# Patient Record
Sex: Female | Born: 1975 | Race: White | Hispanic: Yes | Marital: Single | Smoking: Never smoker
Health system: Southern US, Community
[De-identification: ages and names within clinical notes are randomized; demographics above are authoritative.]

## PROBLEM LIST (undated history)

## (undated) DIAGNOSIS — K8020 Calculus of gallbladder without cholecystitis without obstruction: Secondary | ICD-10-CM

## (undated) HISTORY — PX: CHOLECYSTECTOMY: SHX55

## (undated) HISTORY — DX: Calculus of gallbladder without cholecystitis without obstruction: K80.20

---

## 1998-07-20 ENCOUNTER — Inpatient Hospital Stay (HOSPITAL_COMMUNITY): Admission: AD | Admit: 1998-07-20 | Discharge: 1998-07-22 | Payer: Self-pay | Admitting: *Deleted

## 1999-08-04 ENCOUNTER — Ambulatory Visit (HOSPITAL_COMMUNITY): Admission: RE | Admit: 1999-08-04 | Discharge: 1999-08-04 | Payer: Self-pay | Admitting: Obstetrics

## 1999-08-11 ENCOUNTER — Inpatient Hospital Stay (HOSPITAL_COMMUNITY): Admission: AD | Admit: 1999-08-11 | Discharge: 1999-08-11 | Payer: Self-pay | Admitting: Obstetrics

## 1999-12-04 ENCOUNTER — Inpatient Hospital Stay (HOSPITAL_COMMUNITY): Admission: AD | Admit: 1999-12-04 | Discharge: 1999-12-05 | Payer: Self-pay | Admitting: Obstetrics & Gynecology

## 1999-12-04 ENCOUNTER — Encounter: Payer: Self-pay | Admitting: Obstetrics & Gynecology

## 2002-08-25 ENCOUNTER — Emergency Department (HOSPITAL_COMMUNITY): Admission: EM | Admit: 2002-08-25 | Discharge: 2002-08-25 | Payer: Self-pay | Admitting: Emergency Medicine

## 2002-08-25 ENCOUNTER — Encounter: Payer: Self-pay | Admitting: Emergency Medicine

## 2003-05-19 ENCOUNTER — Other Ambulatory Visit: Admission: RE | Admit: 2003-05-19 | Discharge: 2003-05-19 | Payer: Self-pay | Admitting: Gynecology

## 2003-11-09 ENCOUNTER — Inpatient Hospital Stay (HOSPITAL_COMMUNITY): Admission: AD | Admit: 2003-11-09 | Discharge: 2003-11-11 | Payer: Self-pay | Admitting: Gynecology

## 2003-11-10 ENCOUNTER — Encounter (INDEPENDENT_AMBULATORY_CARE_PROVIDER_SITE_OTHER): Payer: Self-pay | Admitting: *Deleted

## 2007-03-15 ENCOUNTER — Emergency Department (HOSPITAL_COMMUNITY): Admission: EM | Admit: 2007-03-15 | Discharge: 2007-03-15 | Payer: Self-pay | Admitting: Emergency Medicine

## 2007-06-12 ENCOUNTER — Emergency Department (HOSPITAL_COMMUNITY): Admission: EM | Admit: 2007-06-12 | Discharge: 2007-06-12 | Payer: Self-pay | Admitting: Emergency Medicine

## 2008-09-13 ENCOUNTER — Emergency Department (HOSPITAL_COMMUNITY): Admission: EM | Admit: 2008-09-13 | Discharge: 2008-09-13 | Payer: Self-pay | Admitting: Emergency Medicine

## 2008-10-13 ENCOUNTER — Encounter (INDEPENDENT_AMBULATORY_CARE_PROVIDER_SITE_OTHER): Payer: Self-pay | Admitting: General Surgery

## 2008-10-13 ENCOUNTER — Ambulatory Visit (HOSPITAL_COMMUNITY): Admission: RE | Admit: 2008-10-13 | Discharge: 2008-10-13 | Payer: Self-pay | Admitting: General Surgery

## 2011-05-08 NOTE — Op Note (Signed)
NAMEMarland Kitchen  SHERIN, MURDOCH NO.:  [id number]   MEDICAL RECORD NO.:  [id number]          PATIENT TYPE:  AMB   LOCATION:  DAY                          FACILITY:  [facility]   PHYSICIAN:  Juanetta Gosling, MDDATE OF BIRTH:  1976/03/06   DATE OF PROCEDURE:  10/13/2008  DATE OF DISCHARGE:                               OPERATIVE REPORT   PREOPERATIVE DIAGNOSES:  Chronic cholecystitis, status post an acute  cholecystitis episode about a month ago.   POSTOPERATIVE DIAGNOSES:  Chronic cholecystitis, status post an acute  cholecystitis episode about a month ago.   PROCEDURE:  Laparoscopic cholecystectomy.   SURGEON:  Juanetta Gosling, M.D.   ASSISTANT:  Alfonse Ras, M.D.   ANESTHESIA:  General.   SPECIMENS:  Gallbladder and contents.   DISPOSITION OF SPECIMEN:  To pathology.   ESTIMATED BLOOD LOSS:  Minimal.   COMPLICATIONS:  None.   DRAINS:  None.   DISPOSITION OF PATIENT:  To PACU in stable condition.   HISTORY:  This is a 35 year old female, who had an emergency room visit  on September 13, 2008, with right upper quadrant pain, where she  underwent an ultrasound.  She had a 1.5 cm gallstone at the neck with a  positive sonographic Murphy sign.  Her LFTs were normal.  Her common  duct was 3 mm.  She was then referred to the surgery practice for  consideration for laparoscopic cholecystectomy.  On my exam, she  appeared to have had acute cholecystitis, which was still causing her  some significant right upper quadrant pain, scheduled for laparoscopic  cholecystectomy.   DESCRIPTION OF PROCEDURE:  After informed consent was taken, the patient  was administered 1 gm Cefoxitin.  She was taken to the operating room.  Sequential compression devices were placed on her lower extremities.  She underwent general endotracheal anesthesia without complications.  Her abdomen was prepped and draped in standard sterile surgical fashion.  Surgical time-out was then  performed.  A 10 mm incision was used, going  through her prior bilateral tubal ligation scar.  There was a dense  amount of scarring in her umbilicus.  The fascia was identified and  incised.  Peritoneum was then entered bluntly.  A Vicryl purse-string  suture was placed around the fascia and a Hasson trocar was then  introduced.  The abdomen was then insufflated to 15 mmHg pressure.  She  tolerated this well.  She was then placed in a head-up position.  Three  further 5 mm trocars were placed in the epigastrium and two in the right  upper quadrant, under direct vision, after infiltration with local  anesthetic.   There were some adhesions to her liver from her omentum.  These were  taken down bluntly.  The gallbladder was then retracted cephalad and  lateral.  The triangle of Calot was then dissected.  The cystic duct and  cystic artery were clearly identified and entered the gallbladder and  the critical view of safety was obtained.  Following this, I clipped the  cystic duct three times, cut it and left two clips in place.  The same  was done to the cystic  artery.  Electrocautery was then used to remove  the gallbladder from the liver bed.  There was a small amount of liver  bleeding.  This was controlled with electrocautery on the liver bed.  A  5 mm camera was then placed in the epigastrium.  The EndoCatch was then  placed in the umbilicus.  The gallbladder was then placed in the  EndoCatch and brought out through the umbilical incision without  difficulty.  This suture was then tied down and there was no leak and no  evidence of a hernia following this.   Some irrigation was performed.  Hemostasis was observed.  The clips were  in good position.  All trocars were then removed.  The abdomen was  desufflated.  All incisions were then closed with 4-0 Monocryl in a  subcuticular fashion.  Dermabond was then placed over the wounds.  She  tolerated this well, was extubated in the  operating room and transferred  to the PACU in stable condition.      Juanetta Gosling, MD  Electronically Signed     MCW/MEDQ  D:  10/13/2008  T:  10/13/2008  Job:  [id number]

## 2011-05-11 NOTE — Discharge Summary (Signed)
Stacey Blackwell, Stacey Blackwell                         ACCOUNT NO.:  [id number]   MEDICAL RECORD NO.:  [id number]                   PATIENT TYPE:  INP   LOCATION:  9138                                 FACILITY:  [facility]   PHYSICIAN:  Juan H. Lily Peer, M.D.             DATE OF BIRTH:  1976-06-12   DATE OF ADMISSION:  11/09/2003  DATE OF DISCHARGE:  11/11/2003                                 DISCHARGE SUMMARY   DISCHARGE DIAGNOSES:  1. Intrauterine pregnancy at 40+ weeks, delivered.  2. Desires permanent sterilization.  3. Status post vacuum assisted vaginal delivery.  4. Status post bilateral tubal sterilization, Pomeroy technique, by Dr. Reynaldo Minium on  November 10, 2003.   HISTORY:  This is a 35 year old female, gravida 4, para 2, with an EDC of  November 06, 2003. Prenatal course was essentially uncomplicated. The  patient desired permanent sterilization.   HOSPITAL COURSE:  On November 09, 2003, the patient was admitted at 40+  weeks in labor. Subsequently, the patient underwent a vacuum-assisted  vaginal delivery on November 09, 2003, secondary to epidural affect of a  female, Apgars 8 and 9, weight 8 pounds 10 ounces. There was a second degree  vaginal/perineal tear. There were no complications. On November 10, 2003,  the patient underwent a bilateral tubal sterilization procedure via Pomeroy  technique by Dr. Reynaldo Minium without complications. The patient remained  afebrile, voiding, in stable condition, and she was discharged to home on  November 11, 2003, in satisfactory condition.  On subsequent workup on her  labs, the patient is AB positive, rubella immune. On November 10, 2003,  hemoglobin was 9.5.   DISPOSITION:  The patient is discharged to home, follow up in six weeks. If  any problems prior to that time, she should be seen in the office.     Susa Loffler, P.A.                    Juan H. Lily Peer, M.D.    TSG/MEDQ  D:  12/06/2003  T:  12/06/2003  Job:   [id number]

## 2011-05-11 NOTE — Op Note (Signed)
Stacey Blackwell, Stacey Blackwell                         ACCOUNT NO.:  [id number]   MEDICAL RECORD NO.:  [id number]                   PATIENT TYPE:  INP   LOCATION:  9138                                 FACILITY[facility]   PHYSICIAN:  Juan H. Lily Peer, M.D.             DATE OF BIRTH:  Sep 24, 1976   DATE OF PROCEDURE:  11/10/2003  DATE OF DISCHARGE:                                 OPERATIVE REPORT   PREOPERATIVE DIAGNOSES:  1. Status post normal spontaneous vaginal delivery November 09, 2003.  2. Patient requesting elective permanent sterilization.   POSTOPERATIVE DIAGNOSES:  1. Status post normal spontaneous vaginal delivery November 09, 2003.  2. Patient requesting elective permanent sterilization.   PROCEDURE PERFORMED:  Bilateral tubal sterilization procedure, Pomeroy  technique.   SURGEON:  Juan H. Lily Peer, M.D.   ANESTHESIA:  General endotracheal anesthesia.   INDICATION FOR OPERATION:  A 35 year old gravida 4, para 3, AB 1, status  post normal spontaneous vaginal delivery, requesting elective permanent  sterilization.  Risks, benefits, pros and cons of the operation were  discussed.  Patient fully aware that this is permanent sterilization and  will not be able to have any more children.  All of the counseling was done  in [place].  She and her husband were present.  All questions were answered.   DESCRIPTION OF OPERATION:  After the patient was adequately counseled she  was taken to the operating room, where she underwent a successful general  endotracheal anesthesia.  The bladder was evacuated of contents for  approximately 25 mL and the abdomen was prepped and draped in the usual  sterile fashion.  A semilunar incision was made underneath the umbilicus.  The incision was carried down to the layer of fascia.  The peritoneal cavity  was entered with exposure utilizing [employer] retractors.  The peritoneal  cavity was visualized and with the use of a vein retractor, the left  fallopian tube was brought into view and the Babcock clamp was utilized to  grasp the fallopian tube and was identified and the proximal 2 cm segment  was suture ligated with 3-0 Vicryl suture x2 and a 2 cm segment was excised  and passed off the operative field for histologic evaluation.  The remaining  stumps were Bovie cauterized.  A similar procedure was carried out on the  contralateral side.  Once this was completed, the fascia was closed with a  running stitch of 3-0 Vicryl suture.  The subcutaneous bleeders were Bovie  cauterized.  The skin was reapproximated with a subcuticular stitch of 4-0  plain catgut suture.  For postoperative analgesia, 0.25% Marcaine was  infiltrated into the incision site.  The patient was extubated, transferred  to the recovery room with stable vital signs.  Blood loss was minimal.  Fluid resuscitation consisted of 500 mL of lactated Ringer's.  She did  receive 1 g of Ancef prophylactically.  Juan H. Lily Peer, M.D.    JHF/MEDQ  D:  11/10/2003  T:  11/11/2003  Job:  [id number]

## 2011-09-24 LAB — URINALYSIS, ROUTINE W REFLEX MICROSCOPIC
Bilirubin Urine: NEGATIVE
Glucose, UA: NEGATIVE
Hgb urine dipstick: NEGATIVE
Ketones, ur: NEGATIVE
Nitrite: NEGATIVE
Protein, ur: NEGATIVE
Specific Gravity, Urine: 1.018
Urobilinogen, UA: 0.2
pH: 6.5

## 2011-09-24 LAB — CBC
HCT: 35.3 — ABNORMAL LOW
Hemoglobin: 11.6 — ABNORMAL LOW
MCHC: 33
MCV: 76.3 — ABNORMAL LOW
Platelets: 344
RBC: 4.62
RDW: 15.1
WBC: 7.9

## 2011-09-24 LAB — DIFFERENTIAL
Basophils Absolute: 0
Basophils Relative: 0
Eosinophils Absolute: 0.2
Eosinophils Relative: 2
Lymphocytes Relative: 35
Lymphs Abs: 2.7
Monocytes Absolute: 0.6
Monocytes Relative: 8
Neutro Abs: 4.3
Neutrophils Relative %: 55

## 2011-09-24 LAB — LIPASE, BLOOD: Lipase: 25

## 2011-09-24 LAB — COMPREHENSIVE METABOLIC PANEL
ALT: 21
AST: 24
Albumin: 3.4 — ABNORMAL LOW
Alkaline Phosphatase: 56
BUN: 10
CO2: 26
Calcium: 9
Chloride: 105
Creatinine, Ser: 0.71
GFR calc Af Amer: >60
GFR calc non Af Amer: >60
Glucose, Bld: 104 — ABNORMAL HIGH
Potassium: 4.1
Sodium: 137
Total Bilirubin: 0.8
Total Protein: 6.6

## 2011-09-24 LAB — POCT PREGNANCY, URINE: Preg Test, Ur: NEGATIVE

## 2011-09-25 LAB — HEMOGLOBIN AND HEMATOCRIT, BLOOD
HCT: 36
Hemoglobin: 11.9 — ABNORMAL LOW

## 2011-09-25 LAB — COMPREHENSIVE METABOLIC PANEL WITH GFR
ALT: 32
AST: 25
Albumin: 3.4 — ABNORMAL LOW
Alkaline Phosphatase: 56
BUN: 8
CO2: 25
Calcium: 8.7
Chloride: 108
Creatinine, Ser: 0.63
GFR calc non Af Amer: >60
Glucose, Bld: 110 — ABNORMAL HIGH
Potassium: 3.9
Sodium: 140
Total Bilirubin: 0.5
Total Protein: 6.7

## 2011-09-25 LAB — PREGNANCY, URINE: Preg Test, Ur: NEGATIVE

## 2012-06-09 DIAGNOSIS — R1011 Right upper quadrant pain: Secondary | ICD-10-CM | POA: Insufficient documentation

## 2012-06-09 DIAGNOSIS — R1013 Epigastric pain: Secondary | ICD-10-CM | POA: Insufficient documentation

## 2012-06-10 ENCOUNTER — Emergency Department (HOSPITAL_COMMUNITY): Payer: BC Managed Care – PPO

## 2012-06-10 ENCOUNTER — Emergency Department (HOSPITAL_COMMUNITY)
Admission: EM | Admit: 2012-06-10 | Discharge: 2012-06-10 | Disposition: A | Discharge status: Home / Self Care | Payer: BC Managed Care – PPO | Attending: Emergency Medicine | Admitting: Emergency Medicine

## 2012-06-10 ENCOUNTER — Encounter (HOSPITAL_COMMUNITY): Payer: Self-pay | Admitting: Emergency Medicine

## 2012-06-10 DIAGNOSIS — K2970 Gastritis, unspecified, without bleeding: Secondary | ICD-10-CM

## 2012-06-10 LAB — URINALYSIS, ROUTINE W REFLEX MICROSCOPIC
Bilirubin Urine: NEGATIVE
Glucose, UA: NEGATIVE mg/dL
Hgb urine dipstick: NEGATIVE
Ketones, ur: NEGATIVE mg/dL
Leukocytes, UA: NEGATIVE
Nitrite: NEGATIVE
Protein, ur: NEGATIVE mg/dL
Specific Gravity, Urine: 1.015 (ref 1.005–1.030)
Urobilinogen, UA: 0.2 mg/dL (ref 0.0–1.0)
pH: 6 (ref 5.0–8.0)

## 2012-06-10 LAB — DIFFERENTIAL
Basophils Absolute: 0.1 10*3/uL (ref 0.0–0.1)
Basophils Relative: 1 % (ref 0–1)
Eosinophils Absolute: 0.2 10*3/uL (ref 0.0–0.7)
Eosinophils Relative: 3 % (ref 0–5)
Lymphocytes Relative: 44 % (ref 12–46)
Lymphs Abs: 3.3 10*3/uL (ref 0.7–4.0)
Monocytes Absolute: 0.5 10*3/uL (ref 0.1–1.0)
Monocytes Relative: 7 % (ref 3–12)
Neutro Abs: 3.5 10*3/uL (ref 1.7–7.7)
Neutrophils Relative %: 45 % (ref 43–77)

## 2012-06-10 LAB — CBC
HCT: 32.4 % — ABNORMAL LOW (ref 36.0–46.0)
Hemoglobin: 10.4 g/dL — ABNORMAL LOW (ref 12.0–15.0)
MCH: 21.8 pg — ABNORMAL LOW (ref 26.0–34.0)
MCHC: 32.1 g/dL (ref 30.0–36.0)
MCV: 67.8 fL — ABNORMAL LOW (ref 78.0–100.0)
Platelets: 396 10*3/uL (ref 150–400)
RBC: 4.78 MIL/uL (ref 3.87–5.11)
RDW: 15.8 % — ABNORMAL HIGH (ref 11.5–15.5)
WBC: 7.6 10*3/uL (ref 4.0–10.5)

## 2012-06-10 LAB — BASIC METABOLIC PANEL
BUN: 12 mg/dL (ref 6–23)
CO2: 24 mEq/L (ref 19–32)
Calcium: 9.5 mg/dL (ref 8.4–10.5)
Chloride: 100 mEq/L (ref 96–112)
Creatinine, Ser: 0.76 mg/dL (ref 0.50–1.10)
GFR calc Af Amer: >90 mL/min (ref >90)
GFR calc non Af Amer: >90 mL/min (ref >90)
Glucose, Bld: 105 mg/dL — ABNORMAL HIGH (ref 70–99)
Potassium: 3.7 mEq/L (ref 3.5–5.1)
Sodium: 137 mEq/L (ref 135–145)

## 2012-06-10 LAB — POCT PREGNANCY, URINE: Preg Test, Ur: NEGATIVE

## 2012-06-10 MED ORDER — HYDROCODONE-ACETAMINOPHEN 5-325 MG PO TABS: 1.0000 | ORAL_TABLET | ORAL | Status: AC | PRN

## 2012-06-10 MED ORDER — ONDANSETRON HCL 4 MG/2ML IJ SOLN
4.0000 mg | Freq: Once | INTRAMUSCULAR | Status: AC
  Administered 2012-06-10: 4 mg via INTRAVENOUS
  Filled 2012-06-10: qty 2

## 2012-06-10 MED ORDER — PANTOPRAZOLE SODIUM 40 MG IV SOLR
40.0000 mg | Freq: Once | INTRAVENOUS | Status: AC
  Administered 2012-06-10: 40 mg via INTRAVENOUS
  Filled 2012-06-10: qty 40

## 2012-06-10 MED ORDER — HYDROMORPHONE HCL PF 1 MG/ML IJ SOLN
1.0000 mg | Freq: Once | INTRAMUSCULAR | Status: AC
  Administered 2012-06-10: 1 mg via INTRAVENOUS
  Filled 2012-06-10: qty 1

## 2012-06-10 MED ORDER — OMEPRAZOLE 20 MG PO CPDR: 20.0000 mg | DELAYED_RELEASE_CAPSULE | Freq: Every day | ORAL | Status: DC

## 2012-06-10 NOTE — Discharge Instructions (Signed)
Gastritis (Gastritis) La gastritis es una inflamacin (reaccin del organismo a una lesin o infeccin) del estmago. A menudo la causan infecciones virales o bacterianas (grmenes). Otras causas pueden ser ciertas sustancias qumicas (incluyendo el alcohol) y los medicamentos. Esta enfermedad puede estar asociada a un malestar generalizado (sentirse cansado y mal), calambres y fiebre. La enfermedad puede durar entre dos y siete das. Si los sntomas de gastritis continan, podr ser necesario someterse a una gastroscopa (observacin del estmago con un instrumento similar a un telescopio), a una biopsia (extraccin de una muestra de tejido), y/o a pruebas de sangre. Los antibiticos no sern de utilidad a menos que la causa sea una infeccin bacteriana. Cuando la causa es bacteriana, puede deberse a un organismo conocido como H. pylori. En este caso puede tratarse con antibiticos. Otras formas de gastritis se deben a una produccin excesiva de cido en el estmago. Esto puede tratarse con medicamentos como los bloqueadores H2 y los anticidos. Generalmente todo lo que se necesita es un tratamiento en el hogar. Los nios pequeos podrn deshidratarse (perder los lquidos del organismo) con facilidad si los vmitos continan junto con la diarrea. Pueden administrarse algunos medicamentos que controlarn las nuseas. En general no se prescriben medicamentos para la diarrea, a menos que sea particularmente molesta. Algunos medicamentos retrasan la eliminacin de los virus del tracto gastrointestinal. Este factor hace ms lento el proceso de curacin. INSTRUCCIONES PARA EL CUIDADO DOMICILIARIO Instrucciones para el cuidado domiciliario para las nuseas y los vmitos:  Para los adultos: beba con frecuencia cantidades pequeas de lquido. Beba al menos 2 litros por da. Beba a sorbos con frecuencia. No beba grandes cantidades de lquido de una vez. As podr empeorar las nuseas.   Utilice los medicamentos de  venta libre o de prescripcin para el dolor, el malestar o la fiebre, segn se lo indique el profesional que lo asiste.   Beba slo lquidos claros. Son los lquidos transparentes como el agua, caldo, u bebidas ligeras.   Una vez que usted tolera los lquidos claros, puede tomar cualquier lquido, sopas, jugos y helados o sorbetes. Agregue lentamente alimentos blandos (sin condimentar) a la dieta.  Instrucciones para el cuidado domiciliario en los casos de diarrea:  La diarrea puede estar causada por una infeccin bacteriana o un virus. Su problema debe mejorar con el transcurso del tiempo, reposo, lquidos y/o los medicamentos antidiarreicos.   Hasta que la diarrea est bajo control, usted deber beber lquidos claros en pequeas cantidades, con frecuencia. Los lquidos claros incluyen: agua, caldo, gelatina, y t liviano.  Evite:  Leche.   Frutas.   Tabaco.   Alcohol.   Lquidos muy calientes o fros.   Comer demasiado a la vez.  Cuando la diarrea se detenga, puede agregar los siguientes alimentos que ayudan a formar las heces:  Arroz.   Pltanos.   Manzanas sin cscara.   Tostadas secas.  Una vez que estos alimentos son tolerados, puede agregar yogur con bajo contenido de grasa y requesn con bajo contenido de grasa. Estos alimentos harn que recupere el equilibrio de las bacterias intestinales. Lave bien sus manos para evitar que la bacteria o el virus se diseminen. SOLICITE ATENCIN MDICA DE INMEDIATO SI:  No puede retener lquidos.   Los vmitos o la diarrea se hacen persistentes (se repiten una y otra vez).   Aparece dolor abdominal, o ste aumenta o se localiza. Un dolor ubicado hacia el lado derecho del abdomen puede deberse a apendicitis. Un dolor en un adulto ubicado en el   lado izquierdo del abdomen puede sugerir una diverticulitis.   Aparece fiebre alta, entendida como una temperatura de ms de 102 F (38.9 C), o la temperatura que le haya indicado el  profesional que lo asiste, durante ms de 3 das.   La diarrea se hace excesiva o contiene sangre o mucosidad.   Presenta debilidad excesiva, mareos, lipotimia o sed extrema.  Document Released: 09/19/2005 Document Revised: 11/29/2011 [employer] Patient Information 2012 [employer]. 

## 2012-06-10 NOTE — ED Notes (Signed)
Patient is AOx4 and comfortable with her discharge instructions. 

## 2012-06-10 NOTE — ED Notes (Signed)
PT. REPORTS UPPER ABDOMINAL PAIN ONSET YESTERDAY , DENIES NAUSEA ,VOMITTING OR DIARRHEA. NO FEVER OR CHILLS.

## 2012-06-10 NOTE — ED Provider Notes (Signed)
Medical screening examination/treatment/procedure(s) were performed by non-physician practitioner and as supervising physician I was immediately available for consultation/collaboration.  Olivia Mackie, MD 06/10/12 1025

## 2012-06-10 NOTE — ED Notes (Signed)
Pt resting quietly no s/s of any distress noted, plan of care is updated with verbal understanding. Pt is awaiting MD evaluation, pt informed MD is with a  Critical pt at this time and will be in as soon as possible. Drink given to family at bedside.

## 2012-06-10 NOTE — ED Provider Notes (Signed)
History     CSN: [id number]  Arrival date & time 06/09/12  2351   First MD Initiated Contact with Patient 06/10/12 0354      Chief Complaint  Patient presents with  . Abdominal Pain    (Consider location/radiation/quality/duration/timing/severity/associated sxs/prior treatment) HPI Comments: Patient is otherwise healthy 36 year old hispanic female with PMH of cholecystectomy who presents with gradual onset of epigastric and RUQ abdominal pain.  She states that the pain was present yesterday am when she awoke - she was able to eat breakfast without difficulty but reports that though the day, the pain has progressively gotten worse.  She denies increase in pain in relation to food, fever, chills, nausea, vomiting, diarrhea, constipation, vaginal discharge or bleeding, pregnancy, dysuria, hematuria.  She reports this feels like when she needed to have her gallbladder out.  Patient is a 36 y.o. female presenting with abdominal pain. The history is provided by the patient. No language interpreter was used.  Abdominal Pain The primary symptoms of the illness include abdominal pain. The primary symptoms of the illness do not include fever, fatigue, shortness of breath, nausea, vomiting, diarrhea, hematemesis, hematochezia, dysuria, vaginal discharge or vaginal bleeding. The current episode started yesterday. The onset of the illness was gradual. The problem has been gradually worsening.  The patient states that she believes she is currently not pregnant. The patient has not had a change in bowel habit. Symptoms associated with the illness do not include chills, anorexia, diaphoresis, heartburn, constipation, urgency, hematuria, frequency or back pain.    History reviewed. No pertinent past medical history.  Past Surgical History  Procedure Date  . Cholecystectomy     No family history on file.  History  Substance Use Topics  . Smoking status: Never Smoker   . Smokeless tobacco: Not on  file  . Alcohol Use: No    OB History    Grav Para Term Preterm Abortions TAB SAB Ect Mult Living                  Review of Systems  Constitutional: Negative for fever, chills, diaphoresis and fatigue.  Eyes: Negative for pain.  Respiratory: Negative for shortness of breath.   Gastrointestinal: Positive for abdominal pain. Negative for heartburn, nausea, vomiting, diarrhea, constipation, hematochezia, anorexia and hematemesis.  Genitourinary: Negative for dysuria, urgency, frequency, hematuria, vaginal bleeding and vaginal discharge.  Musculoskeletal: Negative for back pain.  Neurological: Negative for headaches.  All other systems reviewed and are negative.    Allergies  Review of patient's allergies indicates no known allergies.  Home Medications  No current outpatient prescriptions on file.  BP 110/59  Pulse 62  Temp 97.9 F (36.6 C) (Oral)  Resp 20  SpO2 100%  LMP 05/26/2012  Physical Exam  Nursing note and vitals reviewed. Constitutional: She is oriented to person, place, and time. She appears well-developed and well-nourished.       Uncomfortable appearing.  HENT:  Head: Normocephalic and atraumatic.  Right Ear: External ear normal.  Left Ear: External ear normal.  Nose: Nose normal.  Mouth/Throat: Oropharynx is clear and moist. No oropharyngeal exudate.  Eyes: Conjunctivae are normal. Pupils are equal, round, and reactive to light. No scleral icterus.  Neck: Normal range of motion. Neck supple.  Cardiovascular: Normal rate, regular rhythm and normal heart sounds.  Exam reveals no gallop and no friction rub.   No murmur heard. Pulmonary/Chest: Breath sounds normal. No respiratory distress. She has no wheezes. She has no rales. She  exhibits no tenderness.  Abdominal: Soft. Bowel sounds are normal. She exhibits no distension and no mass. There is tenderness in the right upper quadrant and epigastric area. There is guarding. There is no rigidity and no rebound.      Musculoskeletal: Normal range of motion. She exhibits no edema and no tenderness.  Lymphadenopathy:    She has no cervical adenopathy.  Neurological: She is alert and oriented to person, place, and time. No cranial nerve deficit.  Skin: Skin is warm and dry. No rash noted. No erythema. No pallor.  Psychiatric: She has a normal mood and affect. Her behavior is normal. Judgment and thought content normal.    ED Course  Procedures (including critical care time)  Labs Reviewed  URINALYSIS, ROUTINE W REFLEX MICROSCOPIC - Abnormal; Notable for the following:    APPearance CLOUDY (*)     All other components within normal limits  CBC - Abnormal; Notable for the following:    Hemoglobin 10.4 (*)     HCT 32.4 (*)     MCV 67.8 (*)     MCH 21.8 (*)     RDW 15.8 (*)     All other components within normal limits  BASIC METABOLIC PANEL - Abnormal; Notable for the following:    Glucose, Bld 105 (*)     All other components within normal limits  DIFFERENTIAL  POCT PREGNANCY, URINE   US Abdomen Complete  06/10/2012  *RADIOLOGY REPORT*  Clinical Data:  Epigastric and right upper quadrant abdominal pain.  ABDOMINAL ULTRASOUND COMPLETE  Comparison:  None  Findings:  Gallbladder:  Status post cholecystectomy; no retained stones seen.  Common Bile Duct:  0.6 cm in diameter; within normal limits in caliber.  Liver:  Mildly increased hepatic echogenicity and coarsened echotexture, likely reflecting fatty infiltration; no focal lesions identified.  Limited Doppler evaluation demonstrates normal blood flow within the liver.  IVC:  Unremarkable in appearance.  Pancreas:  Although the pancreas is difficult to visualize due to overlying bowel gas, no focal pancreatic abnormality is identified.  Spleen:  7.4 cm in length; within normal limits in size and echotexture.  Right kidney:  12.0 cm in length; normal in size, configuration and parenchymal echogenicity.  No evidence of mass or hydronephrosis.  Left  kidney:  10.1 cm in length; normal in size, configuration and parenchymal echogenicity.  No evidence of mass or hydronephrosis.  Abdominal Aorta:  Normal in caliber; no aneurysm identified.  Not visualized distally due to overlying bowel gas.  IMPRESSION:  1.  No acute abnormalities seen within the abdomen; status post cholecystectomy. 2.  Mild fatty infiltration within the liver.  Original Report Authenticated By: Tonia Ghent, M.D.   Results for orders placed during the hospital encounter of 06/10/12  URINALYSIS, ROUTINE W REFLEX MICROSCOPIC      Component Value Range   Color, Urine YELLOW  YELLOW   APPearance CLOUDY (*) CLEAR   Specific Gravity, Urine 1.015  1.005 - 1.030   pH 6.0  5.0 - 8.0   Glucose, UA NEGATIVE  NEGATIVE mg/dL   Hgb urine dipstick NEGATIVE  NEGATIVE   Bilirubin Urine NEGATIVE  NEGATIVE   Ketones, ur NEGATIVE  NEGATIVE mg/dL   Protein, ur NEGATIVE  NEGATIVE mg/dL   Urobilinogen, UA 0.2  0.0 - 1.0 mg/dL   Nitrite NEGATIVE  NEGATIVE   Leukocytes, UA NEGATIVE  NEGATIVE  CBC      Component Value Range   WBC 7.6  4.0 - 10.5 K/uL  RBC 4.78  3.87 - 5.11 MIL/uL   Hemoglobin 10.4 (*) 12.0 - 15.0 g/dL   HCT 45.4 (*) 09.8 - 11.9 %   MCV 67.8 (*) 78.0 - 100.0 fL   MCH 21.8 (*) 26.0 - 34.0 pg   MCHC 32.1  30.0 - 36.0 g/dL   RDW 14.7 (*) 82.9 - 56.2 %   Platelets 396  150 - 400 K/uL  DIFFERENTIAL      Component Value Range   Neutrophils Relative 45  43 - 77 %   Lymphocytes Relative 44  12 - 46 %   Monocytes Relative 7  3 - 12 %   Eosinophils Relative 3  0 - 5 %   Basophils Relative 1  0 - 1 %   Neutro Abs 3.5  1.7 - 7.7 K/uL   Lymphs Abs 3.3  0.7 - 4.0 K/uL   Monocytes Absolute 0.5  0.1 - 1.0 K/uL   Eosinophils Absolute 0.2  0.0 - 0.7 K/uL   Basophils Absolute 0.1  0.0 - 0.1 K/uL   RBC Morphology POLYCHROMASIA PRESENT    BASIC METABOLIC PANEL      Component Value Range   Sodium 137  135 - 145 mEq/L   Potassium 3.7  3.5 - 5.1 mEq/L   Chloride 100  96 - 112 mEq/L     CO2 24  19 - 32 mEq/L   Glucose, Bld 105 (*) 70 - 99 mg/dL   BUN 12  6 - 23 mg/dL   Creatinine, Ser 1.30  0.50 - 1.10 mg/dL   Calcium 9.5  8.4 - 86.5 mg/dL   GFR calc non Af Amer >90  >90 mL/min   GFR calc Af Amer >90  >90 mL/min  POCT PREGNANCY, URINE      Component Value Range   Preg Test, Ur NEGATIVE  NEGATIVE   US Abdomen Complete  06/10/2012  *RADIOLOGY REPORT*  Clinical Data:  Epigastric and right upper quadrant abdominal pain.  ABDOMINAL ULTRASOUND COMPLETE  Comparison:  None  Findings:  Gallbladder:  Status post cholecystectomy; no retained stones seen.  Common Bile Duct:  0.6 cm in diameter; within normal limits in caliber.  Liver:  Mildly increased hepatic echogenicity and coarsened echotexture, likely reflecting fatty infiltration; no focal lesions identified.  Limited Doppler evaluation demonstrates normal blood flow within the liver.  IVC:  Unremarkable in appearance.  Pancreas:  Although the pancreas is difficult to visualize due to overlying bowel gas, no focal pancreatic abnormality is identified.  Spleen:  7.4 cm in length; within normal limits in size and echotexture.  Right kidney:  12.0 cm in length; normal in size, configuration and parenchymal echogenicity.  No evidence of mass or hydronephrosis.  Left kidney:  10.1 cm in length; normal in size, configuration and parenchymal echogenicity.  No evidence of mass or hydronephrosis.  Abdominal Aorta:  Normal in caliber; no aneurysm identified.  Not visualized distally due to overlying bowel gas.  IMPRESSION:  1.  No acute abnormalities seen within the abdomen; status post cholecystectomy. 2.  Mild fatty infiltration within the liver.  Original Report Authenticated By: Tonia Ghent, M.D.     Gastritis   MDM  Patient who is s/p cholecystectomy years ago presents with gradual worsening of epigastric abdominal pain without additional concerning symptoms - she is able to keep down po fluids, abd soft without acute surgical signs,  afebrile, normal labs and ultrasound so I do not think she warrants further imaging.  Plan to give protonix and  pain medication and she can follow up with PCP this week.        Izola Price [place], [place] 06/10/12 [phone]

## 2013-02-05 ENCOUNTER — Emergency Department (HOSPITAL_COMMUNITY)
Admission: EM | Admit: 2013-02-05 | Discharge: 2013-02-06 | Disposition: A | Discharge status: Home / Self Care | Payer: BC Managed Care – PPO | Attending: Emergency Medicine | Admitting: Emergency Medicine

## 2013-02-05 ENCOUNTER — Emergency Department (HOSPITAL_COMMUNITY): Payer: BC Managed Care – PPO

## 2013-02-05 ENCOUNTER — Encounter (HOSPITAL_COMMUNITY): Payer: Self-pay | Admitting: *Deleted

## 2013-02-05 DIAGNOSIS — R51 Headache: Secondary | ICD-10-CM | POA: Insufficient documentation

## 2013-02-05 DIAGNOSIS — R42 Dizziness and giddiness: Secondary | ICD-10-CM | POA: Insufficient documentation

## 2013-02-05 DIAGNOSIS — B9689 Other specified bacterial agents as the cause of diseases classified elsewhere: Secondary | ICD-10-CM

## 2013-02-05 DIAGNOSIS — R109 Unspecified abdominal pain: Secondary | ICD-10-CM | POA: Insufficient documentation

## 2013-02-05 DIAGNOSIS — Z3202 Encounter for pregnancy test, result negative: Secondary | ICD-10-CM | POA: Insufficient documentation

## 2013-02-05 DIAGNOSIS — N760 Acute vaginitis: Secondary | ICD-10-CM | POA: Insufficient documentation

## 2013-02-05 DIAGNOSIS — N949 Unspecified condition associated with female genital organs and menstrual cycle: Secondary | ICD-10-CM | POA: Insufficient documentation

## 2013-02-05 LAB — URINALYSIS, ROUTINE W REFLEX MICROSCOPIC
Bilirubin Urine: NEGATIVE
Glucose, UA: NEGATIVE mg/dL
Hgb urine dipstick: NEGATIVE
Ketones, ur: NEGATIVE mg/dL
Leukocytes, UA: NEGATIVE
Nitrite: NEGATIVE
Protein, ur: NEGATIVE mg/dL
Specific Gravity, Urine: 1.023 (ref 1.005–1.030)
Urobilinogen, UA: 0.2 mg/dL (ref 0.0–1.0)
pH: 6 (ref 5.0–8.0)

## 2013-02-05 LAB — COMPREHENSIVE METABOLIC PANEL
ALT: 42 U/L — ABNORMAL HIGH (ref 0–35)
AST: 33 U/L (ref 0–37)
Albumin: 4.1 g/dL (ref 3.5–5.2)
Alkaline Phosphatase: 86 U/L (ref 39–117)
BUN: 8 mg/dL (ref 6–23)
CO2: 27 mEq/L (ref 19–32)
Calcium: 9.5 mg/dL (ref 8.4–10.5)
Chloride: 100 mEq/L (ref 96–112)
Creatinine, Ser: 0.57 mg/dL (ref 0.50–1.10)
GFR calc Af Amer: >90 mL/min (ref >90)
GFR calc non Af Amer: >90 mL/min (ref >90)
Glucose, Bld: 98 mg/dL (ref 70–99)
Potassium: 4 mEq/L (ref 3.5–5.1)
Sodium: 138 mEq/L (ref 135–145)
Total Bilirubin: 0.2 mg/dL — ABNORMAL LOW (ref 0.3–1.2)
Total Protein: 8.2 g/dL (ref 6.0–8.3)

## 2013-02-05 LAB — CBC WITH DIFFERENTIAL/PLATELET
Basophils Absolute: 0 10*3/uL (ref 0.0–0.1)
Basophils Relative: 0 % (ref 0–1)
Eosinophils Absolute: 0.1 10*3/uL (ref 0.0–0.7)
Eosinophils Relative: 2 % (ref 0–5)
HCT: 36.9 % (ref 36.0–46.0)
Hemoglobin: 11.6 g/dL — ABNORMAL LOW (ref 12.0–15.0)
Lymphocytes Relative: 31 % (ref 12–46)
Lymphs Abs: 2.2 10*3/uL (ref 0.7–4.0)
MCH: 21.5 pg — ABNORMAL LOW (ref 26.0–34.0)
MCHC: 31.4 g/dL (ref 30.0–36.0)
MCV: 68.3 fL — ABNORMAL LOW (ref 78.0–100.0)
Monocytes Absolute: 0.4 10*3/uL (ref 0.1–1.0)
Monocytes Relative: 6 % (ref 3–12)
Neutro Abs: 4.5 10*3/uL (ref 1.7–7.7)
Neutrophils Relative %: 61 % (ref 43–77)
Platelets: 462 10*3/uL — ABNORMAL HIGH (ref 150–400)
RBC: 5.4 MIL/uL — ABNORMAL HIGH (ref 3.87–5.11)
RDW: 16 % — ABNORMAL HIGH (ref 11.5–15.5)
WBC: 7.2 10*3/uL (ref 4.0–10.5)

## 2013-02-05 LAB — WET PREP, GENITAL
Trich, Wet Prep: NONE SEEN
Yeast Wet Prep HPF POC: NONE SEEN

## 2013-02-05 LAB — PREGNANCY, URINE: Preg Test, Ur: NEGATIVE

## 2013-02-05 MED ORDER — ONDANSETRON HCL 4 MG/2ML IJ SOLN
4.0000 mg | Freq: Once | INTRAMUSCULAR | Status: AC
Start: 2013-02-05 — End: 2013-02-05
  Administered 2013-02-05: 4 mg via INTRAVENOUS
  Filled 2013-02-05: qty 2

## 2013-02-05 MED ORDER — MORPHINE SULFATE 4 MG/ML IJ SOLN
2.0000 mg | Freq: Once | INTRAMUSCULAR | Status: AC
  Administered 2013-02-05: 2 mg via INTRAVENOUS
  Filled 2013-02-05: qty 1

## 2013-02-05 MED ORDER — IOHEXOL 300 MG/ML  SOLN
100.0000 mL | Freq: Once | INTRAMUSCULAR | Status: AC | PRN
  Administered 2013-02-05: 100 mL via INTRAVENOUS

## 2013-02-05 MED ORDER — MORPHINE SULFATE 4 MG/ML IJ SOLN
4.0000 mg | Freq: Once | INTRAMUSCULAR | Status: AC
  Administered 2013-02-05: 4 mg via INTRAVENOUS
  Filled 2013-02-05: qty 1

## 2013-02-05 MED ORDER — IOHEXOL 300 MG/ML  SOLN
50.0000 mL | Freq: Once | INTRAMUSCULAR | Status: AC | PRN
  Administered 2013-02-05: 50 mL via ORAL

## 2013-02-05 NOTE — ED Provider Notes (Signed)
History     CSN: [id number]  Arrival date & time 02/05/13  1804   First MD Initiated Contact with Patient 02/05/13 1843      Chief Complaint  Patient presents with  . Abdominal Pain    (Consider location/radiation/quality/duration/timing/severity/associated sxs/prior treatment) The history is provided by the patient and medical records.    Stacey Blackwell is a 37 y.o. female  with a hx of cholecystectomy presents to the Emergency Department complaining of gradual, persistent, progressively worsening right lower part abdominal onset yesterday afternoon. Patient states initially she thought it was simply cramping like her menstrual cycle but she took Midol without relief. She states that her cramping has increased significantly.  Associated symptoms include decreased appetite, cramping with urination.  Lying still makes it better and walking, coughing, movement makes it worse.  Pt denies fever, chills, headache, neck pain, chest pain, shortness of breath, nausea, vomiting, diarrhea, weakness, dizziness.     History reviewed. No pertinent past medical history.  Past Surgical History  Procedure Laterality Date  . Cholecystectomy      No family history on file.  History  Substance Use Topics  . Smoking status: Never Smoker   . Smokeless tobacco: Not on file  . Alcohol Use: No    OB History   Grav Para Term Preterm Abortions TAB SAB Ect Mult Living                  Review of Systems  Constitutional: Negative for fever, diaphoresis, appetite change, fatigue and unexpected weight change.  HENT: Negative for mouth sores, trouble swallowing, neck pain and neck stiffness.   Respiratory: Negative for cough, chest tightness, shortness of breath, wheezing and stridor.   Cardiovascular: Negative for chest pain and palpitations.  Gastrointestinal: Positive for abdominal pain. Negative for nausea, vomiting, diarrhea, constipation, blood in stool, abdominal distention and rectal  pain.  Genitourinary: Positive for pelvic pain. Negative for dysuria, urgency, frequency, hematuria, flank pain and difficulty urinating.  Musculoskeletal: Negative for back pain.  Skin: Negative for rash.  Neurological: Negative for weakness.  Hematological: Negative for adenopathy.  Psychiatric/Behavioral: Negative for confusion.  All other systems reviewed and are negative.    Allergies  Review of patient's allergies indicates no known allergies.  Home Medications   Current Outpatient Rx  Name  Route  Sig  Dispense  Refill  . metroNIDAZOLE (FLAGYL) 500 MG tablet   Oral   Take 1 tablet (500 mg total) by mouth 2 (two) times daily.   14 tablet   0   . ondansetron (ZOFRAN) 4 MG tablet   Oral   Take 1 tablet (4 mg total) by mouth every 6 (six) hours.   12 tablet   0   . oxyCODONE-acetaminophen (PERCOCET) 5-325 MG per tablet   Oral   Take 1 tablet by mouth every 4 (four) hours as needed for pain.   20 tablet   0     BP 95/60  Pulse 67  Temp(Src) 98.3 F (36.8 C) (Oral)  Resp 20  Ht 5\' 5"  (1.651 m)  Wt 185 lb (83.915 kg)  BMI 30.79 kg/m2  SpO2 100%  LMP 01/22/2013  Physical Exam  Nursing note and vitals reviewed. Constitutional: She is oriented to person, place, and time. She appears well-developed and well-nourished.  HENT:  Head: Normocephalic and atraumatic.  Mouth/Throat: Oropharynx is clear and moist.  Eyes: Conjunctivae and EOM are normal. Pupils are equal, round, and reactive to light. No scleral icterus.  Neck: Normal  range of motion.  Cardiovascular: Normal rate, regular rhythm, normal heart sounds and intact distal pulses.  Exam reveals no gallop and no friction rub.   No murmur heard. Pulmonary/Chest: Effort normal and breath sounds normal. No respiratory distress. She has no wheezes. She has no rales. She exhibits no tenderness.  Abdominal: Soft. Normal appearance and bowel sounds are normal. She exhibits no distension and no mass. There is no  hepatosplenomegaly. There is tenderness in the right lower quadrant and suprapubic area. There is rebound (RLQ) and guarding. There is no rigidity, no CVA tenderness and negative Murphy's sign. Hernia confirmed negative in the right inguinal area and confirmed negative in the left inguinal area.    Positive psoas   Genitourinary: Pelvic exam was performed with patient supine. No labial fusion. There is no rash, tenderness, lesion or injury on the right labia. There is no rash, tenderness, lesion or injury on the left labia. Uterus is not deviated, not enlarged, not fixed and not tender. Cervix exhibits no motion tenderness, no discharge and no friability. Right adnexum displays tenderness and fullness. Right adnexum displays no mass. Left adnexum displays no mass, no tenderness and no fullness. No erythema, tenderness or bleeding around the vagina. No foreign body around the vagina. No signs of injury around the vagina. Vaginal discharge (thick, white) found.  Significant tenderness the right adnexa on exam.  Lymphadenopathy:    She has no cervical adenopathy.       Right: No inguinal adenopathy present.       Left: No inguinal adenopathy present.  Neurological: She is alert and oriented to person, place, and time. No cranial nerve deficit. She exhibits normal muscle tone. Coordination normal.  Skin: Skin is warm and dry. No rash noted. No erythema.  Psychiatric: She has a normal mood and affect.    ED Course  Procedures (including critical care time)  Labs Reviewed  WET PREP, GENITAL - Abnormal; Notable for the following:    Clue Cells Wet Prep HPF POC FEW (*)    WBC, Wet Prep HPF POC FEW (*)    All other components within normal limits  CBC WITH DIFFERENTIAL - Abnormal; Notable for the following:    RBC 5.40 (*)    Hemoglobin 11.6 (*)    MCV 68.3 (*)    MCH 21.5 (*)    RDW 16.0 (*)    Platelets 462 (*)    All other components within normal limits  COMPREHENSIVE METABOLIC PANEL -  Abnormal; Notable for the following:    ALT 42 (*)    Total Bilirubin 0.2 (*)    All other components within normal limits  GC/CHLAMYDIA PROBE AMP  URINALYSIS, ROUTINE W REFLEX MICROSCOPIC  PREGNANCY, URINE   US Transvaginal Non-ob  02/06/2013  *RADIOLOGY REPORT*  Clinical Data:  Right lower quadrant abdominal pain.  TRANSABDOMINAL AND TRANSVAGINAL ULTRASOUND OF PELVIS DOPPLER ULTRASOUND OF OVARIES  Technique:  Both transabdominal and transvaginal ultrasound examinations of the pelvis were performed. Transabdominal technique was performed for global imaging of the pelvis including uterus, ovaries, adnexal regions, and pelvic cul-de-sac.  It was necessary to proceed with endovaginal exam following the transabdominal exam to visualize the ovaries in greater detail.  Color and duplex Doppler ultrasound was utilized to evaluate blood flow to the ovaries.  Comparison:  CT of the abdomen and pelvis performed earlier today at 10:03 p.m.  Findings:  Uterus:  Normal in size and appearance; measures 8.6 x 4.1 x 5.5 cm.  Endometrium:  Normal  in thickness and appearance; measures 0.9 cm in thickness.  Right ovary: Normal appearance/no adnexal mass; measures 2.4 x 1.4 x 1.5 cm.  Left ovary:   Normal appearance/no adnexal mass; measures 4.3 x 2.2 x 2.6 cm.  Pulsed Doppler evaluation demonstrates normal low-resistance arterial and venous waveforms in both ovaries.  No free fluid is seen within the pelvic cul-de-sac.  IMPRESSION: Normal exam.  No evidence of pelvic mass or other significant abnormality.  No sonographic evidence for ovarian torsion.   Original Report Authenticated By: Tonia Ghent, M.D.    US Pelvis Complete  02/06/2013  *RADIOLOGY REPORT*  Clinical Data:  Right lower quadrant abdominal pain.  TRANSABDOMINAL AND TRANSVAGINAL ULTRASOUND OF PELVIS DOPPLER ULTRASOUND OF OVARIES  Technique:  Both transabdominal and transvaginal ultrasound examinations of the pelvis were performed. Transabdominal technique  was performed for global imaging of the pelvis including uterus, ovaries, adnexal regions, and pelvic cul-de-sac.  It was necessary to proceed with endovaginal exam following the transabdominal exam to visualize the ovaries in greater detail.  Color and duplex Doppler ultrasound was utilized to evaluate blood flow to the ovaries.  Comparison:  CT of the abdomen and pelvis performed earlier today at 10:03 p.m.  Findings:  Uterus:  Normal in size and appearance; measures 8.6 x 4.1 x 5.5 cm.  Endometrium:  Normal in thickness and appearance; measures 0.9 cm in thickness.  Right ovary: Normal appearance/no adnexal mass; measures 2.4 x 1.4 x 1.5 cm.  Left ovary:   Normal appearance/no adnexal mass; measures 4.3 x 2.2 x 2.6 cm.  Pulsed Doppler evaluation demonstrates normal low-resistance arterial and venous waveforms in both ovaries.  No free fluid is seen within the pelvic cul-de-sac.  IMPRESSION: Normal exam.  No evidence of pelvic mass or other significant abnormality.  No sonographic evidence for ovarian torsion.   Original Report Authenticated By: Tonia Ghent, M.D.    Ct Abdomen Pelvis W Contrast  02/05/2013  *RADIOLOGY REPORT*  Clinical Data: Right lower quadrant abdominal pain, nausea and vomiting.  CT ABDOMEN AND PELVIS WITH CONTRAST  Technique:  Multidetector CT imaging of the abdomen and pelvis was performed following the standard protocol during bolus administration of intravenous contrast.  Contrast: OMNIPAQUE IOHEXOL 300 MG/ML  SOLN  Comparison: Abdominal ultrasound performed 06/10/2012  Findings: The visualized lung bases are clear.  The liver and spleen are unremarkable in appearance.  The patient is status post cholecystectomy, with clips noted at the gallbladder fossa.  The pancreas and adrenal glands are unremarkable.  The kidneys are unremarkable in appearance.  There is no evidence of hydronephrosis.  No renal or ureteral stones are seen.  No perinephric stranding is appreciated.  No free  fluid is identified.  The small bowel is unremarkable in appearance.  The stomach is within normal limits.  No acute vascular abnormalities are seen.  The appendix remains normal in caliber, measuring at most 6 mm, though there is minimal adjacent soft tissue stranding.  Given the normal caliber, findings likely do not reflect appendicitis.  The colon is unremarkable in appearance.  The bladder is mildly distended and grossly unremarkable.  The uterus is within normal limits.  The ovaries are relatively symmetric; no suspicious adnexal masses are seen.  No inguinal lymphadenopathy is seen.  No acute osseous abnormalities are identified.  IMPRESSION:  1.  No definite acute abnormality seen within the abdomen or pelvis. 2.  Minimal soft tissue stranding noted about the appendix; however, it remains normal in caliber, without definite evidence for  appendicitis.   Original Report Authenticated By: Tonia Ghent, M.D.    [place] Art/ven Flow Abd Pelv Doppler  02/06/2013  *RADIOLOGY REPORT*  Clinical Data:  Right lower quadrant abdominal pain.  TRANSABDOMINAL AND TRANSVAGINAL ULTRASOUND OF PELVIS DOPPLER ULTRASOUND OF OVARIES  Technique:  Both transabdominal and transvaginal ultrasound examinations of the pelvis were performed. Transabdominal technique was performed for global imaging of the pelvis including uterus, ovaries, adnexal regions, and pelvic cul-de-sac.  It was necessary to proceed with endovaginal exam following the transabdominal exam to visualize the ovaries in greater detail.  Color and duplex Doppler ultrasound was utilized to evaluate blood flow to the ovaries.  Comparison:  CT of the abdomen and pelvis performed earlier today at 10:03 p.m.  Findings:  Uterus:  Normal in size and appearance; measures 8.6 x 4.1 x 5.5 cm.  Endometrium:  Normal in thickness and appearance; measures 0.9 cm in thickness.  Right ovary: Normal appearance/no adnexal mass; measures 2.4 x 1.4 x 1.5 cm.  Left ovary:   Normal  appearance/no adnexal mass; measures 4.3 x 2.2 x 2.6 cm.  Pulsed Doppler evaluation demonstrates normal low-resistance arterial and venous waveforms in both ovaries.  No free fluid is seen within the pelvic cul-de-sac.  IMPRESSION: Normal exam.  No evidence of pelvic mass or other significant abnormality.  No sonographic evidence for ovarian torsion.   Original Report Authenticated By: Tonia Ghent, M.D.      1. Abdominal pain   2. BV (bacterial vaginosis)       MDM  Murlean Iba presents with abdominal pain concerning for appendicitis.  Patient afebrile without leukocytosis but will proceed with CT scan.  UA without evidence of urinary tract infection. CMP unremarkable.    CT scan with minimal soft tissue stranding noted about the appendix however it remains normal in caliber without definite evidence for appendicitis.  Abdominal exam patient with significant right adnexal pain but no palpable mass.  Will proceed with wet prep and ultrasound.  Wet prep with a few clue cells and a few white blood cells, no yeast or trichomoniasis.  Ultrasound of abdomen pelvis with normal exam no evidence of pelvic mass or significant abnormality. No evidence for ovarian torsion.  Patient is nontoxic, nonseptic appearing, in no apparent distress.  Patient's pain and other symptoms adequately managed in emergency department.  Labs, imaging and vitals reviewed.  Patient does not meet the SIRS or Sepsis criteria.  On repeat exam patient does not have a surgical abdomin and there are nor peritoneal signs.  No indication of appendicitis, bowel obstruction, bowel perforation, cholecystitis, diverticulitis, PID or ectopic pregnancy.  Patient discharged home with symptomatic treatment and given strict instructions for follow-up with their primary care physician or return to the ED if symptoms persist.  I have also discussed reasons to return immediately to the ER.  Patient expresses understanding and agrees with  plan.   1. Medications: flagyl, vicodin, zofran, usual home medications 2. Treatment: rest, drink plenty of fluids, advance diet slowly 3. Follow Up: Please followup with your primary doctor for discussion of your diagnoses and further evaluation after today's visit; if you do not have a primary care doctor use the resource guide provided to find one;         Dierdre Forth, PA-C 02/06/13 0045

## 2013-02-05 NOTE — ED Notes (Signed)
The pt has had abd pain since yesterday no n v  Or diarrhea.  lmp 2 weeks ago

## 2013-02-05 NOTE — ED Notes (Signed)
Patient reports she had onset of right lower quad pain last night.  She states she has increased pain when she moves, when her abdomen is touched and she feels something pulling when she voids.  Patient reports normal period at end of January. Patient denies vaginal discharge, denies odor.  Patient states the pain seems to radiate into her right upper leg with cramping pain. Patient reports normal bm today.  She deneis n/v.  Patient states she does have a headache and feels a little dizzy.  Patient last po intake was 1300 today.

## 2013-02-06 LAB — GC/CHLAMYDIA PROBE AMP
CT Probe RNA: NEGATIVE
GC Probe RNA: NEGATIVE

## 2013-02-06 MED ORDER — ONDANSETRON HCL 4 MG PO TABS: 4.0000 mg | ORAL_TABLET | Freq: Four times a day (QID) | ORAL | Status: DC

## 2013-02-06 MED ORDER — OXYCODONE-ACETAMINOPHEN 5-325 MG PO TABS: 1.0000 | ORAL_TABLET | ORAL | Status: DC | PRN

## 2013-02-06 MED ORDER — METRONIDAZOLE 500 MG PO TABS: 500.0000 mg | ORAL_TABLET | Freq: Two times a day (BID) | ORAL | Status: DC

## 2013-02-09 NOTE — ED Provider Notes (Signed)
  Medical screening examination/treatment/procedure(s) were performed by non-physician practitioner and as supervising physician I was immediately available for consultation/collaboration.    Gerhard Munch, MD 02/09/13 [phone]

## 2016-03-08 ENCOUNTER — Encounter (HOSPITAL_COMMUNITY): Payer: Self-pay | Admitting: Emergency Medicine

## 2016-03-08 ENCOUNTER — Emergency Department (HOSPITAL_COMMUNITY): Payer: BLUE CROSS/BLUE SHIELD

## 2016-03-08 ENCOUNTER — Emergency Department (HOSPITAL_COMMUNITY)
Admission: EM | Admit: 2016-03-08 | Discharge: 2016-03-08 | Disposition: A | Discharge status: Home / Self Care | Payer: BLUE CROSS/BLUE SHIELD | Attending: Emergency Medicine | Admitting: Emergency Medicine

## 2016-03-08 DIAGNOSIS — R079 Chest pain, unspecified: Secondary | ICD-10-CM | POA: Diagnosis present

## 2016-03-08 DIAGNOSIS — R42 Dizziness and giddiness: Secondary | ICD-10-CM | POA: Diagnosis not present

## 2016-03-08 DIAGNOSIS — R062 Wheezing: Secondary | ICD-10-CM | POA: Insufficient documentation

## 2016-03-08 LAB — CBC
HCT: 34.8 % — ABNORMAL LOW (ref 36.0–46.0)
Hemoglobin: 10.7 g/dL — ABNORMAL LOW (ref 12.0–15.0)
MCH: 20.3 pg — ABNORMAL LOW (ref 26.0–34.0)
MCHC: 30.7 g/dL (ref 30.0–36.0)
MCV: 66 fL — ABNORMAL LOW (ref 78.0–100.0)
Platelets: 395 10*3/uL (ref 150–400)
RBC: 5.27 MIL/uL — ABNORMAL HIGH (ref 3.87–5.11)
RDW: 15.7 % — ABNORMAL HIGH (ref 11.5–15.5)
WBC: 7 10*3/uL (ref 4.0–10.5)

## 2016-03-08 LAB — BASIC METABOLIC PANEL
Anion gap: 11 (ref 5–15)
BUN: 8 mg/dL (ref 6–20)
CO2: 25 mmol/L (ref 22–32)
Calcium: 9.7 mg/dL (ref 8.9–10.3)
Chloride: 102 mmol/L (ref 101–111)
Creatinine, Ser: 0.69 mg/dL (ref 0.44–1.00)
GFR calc Af Amer: >60 mL/min (ref >60)
GFR calc non Af Amer: >60 mL/min (ref >60)
Glucose, Bld: 110 mg/dL — ABNORMAL HIGH (ref 65–99)
Potassium: 4.3 mmol/L (ref 3.5–5.1)
Sodium: 138 mmol/L (ref 135–145)

## 2016-03-08 LAB — I-STAT TROPONIN, ED: Troponin i, poc: 0 ng/mL (ref 0.00–0.08)

## 2016-03-08 MED ORDER — GI COCKTAIL ~~LOC~~
30.0000 mL | Freq: Once | ORAL | Status: AC
  Administered 2016-03-08: 30 mL via ORAL
  Filled 2016-03-08: qty 30

## 2016-03-08 MED ORDER — ACETAMINOPHEN 325 MG PO TABS
650.0000 mg | ORAL_TABLET | Freq: Once | ORAL | Status: AC
  Administered 2016-03-08: 650 mg via ORAL
  Filled 2016-03-08: qty 2

## 2016-03-08 NOTE — Progress Notes (Signed)
Patient listed as having [employer] insurance with out a pcp.  EDCM spoke to patient at bedside.  Patient confrims she does not have a pcp.  EDCm provided patient with a list of pcps who accept [employer] insurance within a ten mile radius of patient's zip code.  Patient thankful for services.  No further EDCM needs at this time.

## 2016-03-08 NOTE — ED Notes (Signed)
Patient presents for centralized CP x1 day, lightheadedness, SOB, N/V. Rates pain 8/10. Denies diaphoresis.

## 2016-03-08 NOTE — Discharge Instructions (Signed)
Please read and follow all provided instructions.  Your diagnoses today include:  1. Chest pain, unspecified chest pain type    Tests performed today include:  An EKG of your heart  A chest x-ray  Cardiac enzymes - a blood test for heart muscle damage  Blood counts and electrolytes  Vital signs. See below for your results today.   Medications prescribed:   Take any prescribed medications only as directed.  Follow-up instructions: Please follow-up with your primary care provider as soon as you can for further evaluation of your symptoms.   Return instructions:  SEEK IMMEDIATE MEDICAL ATTENTION IF:  You have severe chest pain, especially if the pain is crushing or pressure-like and spreads to the arms, back, neck, or jaw, or if you have sweating, nausea (feeling sick to your stomach), or shortness of breath. THIS IS AN EMERGENCY. Don't wait to see if the pain will go away. Get medical help at once. Call 911 or 0 (operator). DO NOT drive yourself to the hospital.   Your chest pain gets worse and does not go away with rest.   You have an attack of chest pain lasting longer than usual, despite rest and treatment with the medications your caregiver has prescribed.   You wake from sleep with chest pain or shortness of breath.  You feel dizzy or faint.  You have chest pain not typical of your usual pain for which you originally saw your caregiver.   You have any other emergent concerns regarding your health.  Additional Information: Chest pain comes from many different causes. Your caregiver has diagnosed you as having chest pain that is not specific for one problem, but does not require admission.  You are at low risk for an acute heart condition or other serious illness.   Your vital signs today were: BP 112/70 mmHg   Pulse 70   Temp(Src) 98.4 F (36.9 C) (Oral)   Resp 18   SpO2 100%   LMP 03/08/2016 If your blood pressure (BP) was elevated above 135/85 this visit, please  have this repeated by your doctor within one month. --------------

## 2016-03-08 NOTE — ED Provider Notes (Signed)
CSN: [id number]     Arrival date & time 03/08/16  1529 History   First MD Initiated Contact with Patient 03/08/16 1553     Chief Complaint  Patient presents with  . Chest Pain   (Consider location/radiation/quality/duration/timing/severity/associated sxs/prior Treatment) HPI  40 y.o. female with hx Cholecystectomy presents to the Emergency Department today complaining of chest pain and feeling light headed since 1pm. States that she was sitting and work and felt light headed with pressure to the center of her chest. Describes the pain as a 6/10 pressure with radiation to the left arm. Notes that 2 weeks ago she had pain on the left side of her chest near axilla. Notices the pain migrating from the center of her chest to the axilla as well. Husband notes that she has been working lots of shifts at her job and sleeps only 1-2 hours a night. Notes increase stress as well. Has not tried an OTC relief. No FH of ACS. No Risk factors including: DM, HTN, HLD, Smoking. Notes some Nausea. No V/D. No SOB/ABD pain. No fevers. No diaphoresis. No other symptoms noted.   History reviewed. No pertinent past medical history. Past Surgical History  Procedure Laterality Date  . Cholecystectomy     No family history on file. Social History  Substance Use Topics  . Smoking status: Never Smoker   . Smokeless tobacco: None  . Alcohol Use: No   OB History    No data available     Review of Systems ROS reviewed and all are negative for acute change except as noted in the HPI.  Allergies  Review of patient's allergies indicates no known allergies.  Home Medications   Prior to Admission medications   Not on File   BP 112/70 mmHg  Pulse 70  Temp(Src) 98.4 F (36.9 C) (Oral)  Resp 18  SpO2 100%  LMP 02/11/2016 (Approximate)   Physical Exam  Constitutional: She is oriented to person, place, and time. She appears well-developed and well-nourished.  HENT:  Head: Normocephalic and atraumatic.  Eyes:  EOM are normal. Pupils are equal, round, and reactive to light.  Neck: Normal range of motion. Neck supple. No tracheal deviation present.  Cardiovascular: Normal rate, regular rhythm and normal heart sounds.   No murmur heard. Pulmonary/Chest: Effort normal. No respiratory distress. She has wheezes in the right upper field and the left upper field.  Abdominal: Soft. Normal appearance and bowel sounds are normal. There is no tenderness.  Musculoskeletal: Normal range of motion.  Neurological: She is alert and oriented to person, place, and time.  Skin: Skin is warm and dry.  Psychiatric: She has a normal mood and affect. Her behavior is normal. Thought content normal.  Nursing note and vitals reviewed.   ED Course  Procedures (including critical care time) Labs Review Labs Reviewed  BASIC METABOLIC PANEL - Abnormal; Notable for the following:    Glucose, Bld 110 (*)    All other components within normal limits  CBC - Abnormal; Notable for the following:    RBC 5.27 (*)    Hemoglobin 10.7 (*)    HCT 34.8 (*)    MCV 66.0 (*)    MCH 20.3 (*)    RDW 15.7 (*)    All other components within normal limits  I-STAT TROPOININ, ED   Imaging Review Dg Chest 2 View  03/08/2016  CLINICAL DATA:  Left-sided chest pain for 3 days, initial encounter EXAM: CHEST  2 VIEW COMPARISON:  None. FINDINGS: The  heart size and mediastinal contours are within normal limits. Both lungs are clear. The visualized skeletal structures are unremarkable. IMPRESSION: No active cardiopulmonary disease. Electronically Signed   By: Alcide CleverMark  Lukens M.D.   On: 03/08/2016 16:20   I have personally reviewed and evaluated these images and lab results as part of my medical decision-making.   EKG Interpretation None      MDM  I have reviewed and evaluated the relevant laboratory values.I have reviewed and evaluated the relevant imaging studies.I personally evaluated and interpreted the relevant EKG.I have reviewed the  relevant previous healthcare records.I have reviewed EMS Documentation.I obtained HPI from historian. Patient discussed with supervising physician  ED Course:  Assessment: Pt is a 39yF presents with CP since 1pm. Risk Factors none. Given GI cocktail in ED with relief of symtpoms. Patient is to be discharged with recommendation to follow up with PCP in regards to today's hospital visit. Chest pain is not likely of cardiac or pulmonary etiology d/t presentation, perc negative, VSS, no tracheal deviation, no JVD or new murmur, RRR, breath sounds equal bilaterally, EKG without acute abnormalities, negative troponin, and negative CXR. Most likely fatigue combined with anxiety from work and limited sleep. Advised to return to the ED is CP becomes exertional, associated with diaphoresis or nausea, radiates to left jaw/arm, worsens or becomes concerning in any way. Pt appears reliable for follow up and is agreeable to discharge. Patient is in no acute distress. Vital Signs are stable. Patient is able to ambulate. Patient able to tolerate PO.  Heart Score: 1  Disposition/Plan:  DC Home Additional Verbal discharge instructions given and discussed with patient.  Pt Instructed to f/u with PCP in the next 48 hours for evaluation and treatment of symptoms. Return precautions given Pt acknowledges and agrees with plan  Supervising Physician Loren Raceravid Yelverton, MD   Final diagnoses:  Chest pain, unspecified chest pain type        Audry Piliyler Latreshia Beauchaine, PA-C 03/08/16 1824  Loren Raceravid Yelverton, MD 03/08/16 [phone]

## 2016-03-08 NOTE — ED Notes (Signed)
No answer from waiting room.

## 2016-06-28 ENCOUNTER — Ambulatory Visit (INDEPENDENT_AMBULATORY_CARE_PROVIDER_SITE_OTHER): Payer: Self-pay | Admitting: Licensed Clinical Social Worker

## 2016-06-28 DIAGNOSIS — F329 Major depressive disorder, single episode, unspecified: Secondary | ICD-10-CM

## 2016-06-28 DIAGNOSIS — F32A Depression, unspecified: Secondary | ICD-10-CM

## 2016-06-28 NOTE — Progress Notes (Signed)
   THERAPY PROGRESS NOTE  Session Time: 60min  Participation Level: Active  Behavioral Response: Neat and Well GroomedAlertDepressed  Type of Therapy: Individual Therapy  Treatment Goals addressed: Coping  Interventions: Motivational Interviewing and Supportive  Summary: Stacey Blackwell is a 40 y.o. female who presents with a depressed, tearful mood and appropriate affect. She shared that she was seeking counseling because she "really needed help." She shared at length about a relationship with a man in [place], which broke her heart. She reported that he was her first love and also the father of her older son, but he did not treat her well and they split up. After [place] the [place], she married another man, who she did not feel she loved. Three years ago, she returned to [place] after her father's death and immediately resumed a romance with her ex. She returned several times after they decided to be together. Recently she found out that he is in another relationship and has been lying to her. She reported that she is struggling to cope with her heartbreak and sadness while at the same time having to face her family's anger. She reported that her children are extremely angry with her for separating from her husband. Stacey HaysRosario shared that she recently sent her older son to [place] for substance abuse treatment, but he resisted and now hates her. She reported that she is losing weight quickly and cries all the time. She expressed hope that counseling will help her to feel better.   Suicidal/Homicidal: Nowithout intent/plan  Therapist Response: LCSW began the clinical assessment but was unable to finish due to time constraints. LCSW utilized supportive counseling techniques throughout the session in order to validate emotions and encourage open expression of emotion. LCSW provided hope that counseling could help Stacey Blackwell to manage her symptoms and resolve relationship problems.  Plan:  Return again in 1 weeks.  Diagnosis: Axis I: See current hospital problem list    Axis II: No diagnosis    Stacey Simmeratosha Palmina Clodfelter, LCSW 06/28/2016

## 2016-07-06 ENCOUNTER — Ambulatory Visit (INDEPENDENT_AMBULATORY_CARE_PROVIDER_SITE_OTHER): Payer: Self-pay | Admitting: Licensed Clinical Social Worker

## 2016-07-06 DIAGNOSIS — F322 Major depressive disorder, single episode, severe without psychotic features: Secondary | ICD-10-CM
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
## 2016-07-17 ENCOUNTER — Ambulatory Visit (INDEPENDENT_AMBULATORY_CARE_PROVIDER_SITE_OTHER): Payer: Self-pay | Admitting: Licensed Clinical Social Worker

## 2016-07-17 DIAGNOSIS — F322 Major depressive disorder, single episode, severe without psychotic features: Secondary | ICD-10-CM
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
## 2016-07-24 ENCOUNTER — Other Ambulatory Visit: Payer: Self-pay | Admitting: Licensed Clinical Social Worker

## 2016-07-31 ENCOUNTER — Ambulatory Visit (INDEPENDENT_AMBULATORY_CARE_PROVIDER_SITE_OTHER): Payer: Self-pay | Admitting: Licensed Clinical Social Worker

## 2016-07-31 DIAGNOSIS — F322 Major depressive disorder, single episode, severe without psychotic features: Secondary | ICD-10-CM

## 2016-07-31 NOTE — Progress Notes (Signed)
   THERAPY PROGRESS NOTE  Session Time: 60min  Participation Level: Active  Behavioral Response: Casual and Well GroomedAlertDepressed  Type of Therapy: Individual Therapy  Treatment Goals addressed: Coping  Interventions: Supportive  Summary: Stacey BoersRosario Y Blackwell is a 40 y.o. female who presents with a depressed mood and appropriate affect. She reported that she was doing "somewhat better" in that she has been able to focus at work and is crying less. She reported that she feels hopeful for the first time that she can get better slowly. She shared at length about her relationships with all of her children, particularly her two sons. She expressed frustration that her older son is distant and uncommunicative, as he continues to be angry with her regarding the separation. She appeared somewhat receptive to Johnson [employer] JohnsonLCSW feedback regarding communication strategies for adolescents. She agreed to try some of the strategies. She reported that her younger son is also very mad at her and continues to refuse to live with her. She expressed the hurt that this causes her. She shared that she continues to visit him daily at his father's house and tries to have positive communication with her ex-husband during the visits.  Suicidal/Homicidal: Nowithout intent/plan  Therapist Response: LCSW utilized supportive counseling techniques throughout the session in order to validate emotions and encourage open expression of emotion. LCSW encouraged Stacey HaysRosario to respond to her older son's emotions instead of his behaviors. LCSW role-played and modeled different communication strategies focused on reflecting emotions and encouraging communication.  Plan: Return again in 2 weeks.  Diagnosis: Axis I: See current hospital problem list    Axis II: No diagnosis    Nilda Simmeratosha Glada Wickstrom, LCSW 07/31/2016

## 2016-08-14 ENCOUNTER — Other Ambulatory Visit: Payer: Self-pay | Admitting: Licensed Clinical Social Worker

## 2016-12-12 ENCOUNTER — Emergency Department (HOSPITAL_COMMUNITY): Payer: BLUE CROSS/BLUE SHIELD

## 2016-12-12 ENCOUNTER — Encounter (HOSPITAL_COMMUNITY): Payer: Self-pay | Admitting: Vascular Surgery

## 2016-12-12 ENCOUNTER — Emergency Department (HOSPITAL_COMMUNITY)
Admission: EM | Admit: 2016-12-12 | Discharge: 2016-12-12 | Disposition: A | Discharge status: Home / Self Care | Payer: BLUE CROSS/BLUE SHIELD | Attending: Emergency Medicine | Admitting: Emergency Medicine

## 2016-12-12 DIAGNOSIS — J069 Acute upper respiratory infection, unspecified: Secondary | ICD-10-CM | POA: Insufficient documentation

## 2016-12-12 DIAGNOSIS — B9789 Other viral agents as the cause of diseases classified elsewhere: Secondary | ICD-10-CM

## 2016-12-12 DIAGNOSIS — J309 Allergic rhinitis, unspecified: Secondary | ICD-10-CM | POA: Diagnosis not present

## 2016-12-12 DIAGNOSIS — R05 Cough: Secondary | ICD-10-CM | POA: Diagnosis present

## 2016-12-12 MED ORDER — CETIRIZINE HCL 10 MG PO TABS: 10.0000 mg | ORAL_TABLET | Freq: Every day | ORAL | 1 refills | Status: DC

## 2016-12-12 MED ORDER — DEXAMETHASONE 4 MG PO TABS: 10.0000 mg | ORAL_TABLET | Freq: Once | ORAL | Status: DC

## 2016-12-12 MED ORDER — FLUTICASONE PROPIONATE 50 MCG/ACT NA SUSP: 2.0000 | Freq: Every day | NASAL | 0 refills | Status: DC

## 2016-12-12 MED ORDER — DEXAMETHASONE 10 MG/ML FOR PEDIATRIC ORAL USE: 10.0000 mg | Freq: Once | INTRAMUSCULAR | Status: DC

## 2016-12-12 MED ORDER — DEXAMETHASONE 10 MG/ML FOR PEDIATRIC ORAL USE
10.0000 mg | Freq: Once | INTRAMUSCULAR | Status: DC
  Filled 2016-12-12: qty 1

## 2016-12-12 MED ORDER — ACETAMINOPHEN 325 MG PO TABS
650.0000 mg | ORAL_TABLET | Freq: Once | ORAL | Status: AC
  Administered 2016-12-12: 650 mg via ORAL
  Filled 2016-12-12: qty 2

## 2016-12-12 MED ORDER — BENZONATATE 100 MG PO CAPS: 100.0000 mg | ORAL_CAPSULE | Freq: Three times a day (TID) | ORAL | 0 refills | Status: DC | PRN

## 2016-12-12 MED ORDER — DEXAMETHASONE 4 MG PO TABS
10.0000 mg | ORAL_TABLET | Freq: Once | ORAL | Status: AC
  Administered 2016-12-12: 10 mg via ORAL
  Filled 2016-12-12: qty 3

## 2016-12-12 NOTE — ED Notes (Signed)
Patient transported to X-ray 

## 2016-12-12 NOTE — ED Triage Notes (Signed)
Pt reports to the ED for eval of persistent cough x 1 month. Reports it is a productive green cough. She has been trying teas and OTC treatments without relief. She also has a hoarse voice, back pain, and intermittent fevers. Reports emesis but only with severe coughing fits.

## 2016-12-12 NOTE — ED Notes (Signed)
See EDP assessment 

## 2016-12-12 NOTE — ED Provider Notes (Signed)
[facility]-EMERGENCY DEPT Provider Note   CSN: [id number] Arrival date & time: 12/12/16  2107  By signing my name below, I, Stacey Blackwell, attest that this documentation has been prepared under the direction and in the presence of Stacey Ashantee Deupree, PA-C Electronically Signed: Soijett Blackwell, ED Scribe. 12/12/16. 10:05 PM.  History   Chief Complaint Chief Complaint  Patient presents with  . Cough    HPI Stacey Blackwell is a 40 y.o. female who presents to the Emergency Department complaining of cough onset 1 month ago. She is having associated symptoms of rhinorrhea, sneezing, post-nasal drip, occasional post-tussive emesis, hoarse voice, generalized body aches due to cough,  ear pain, ear pressure, and chest wall pain with cough. She reports a subjective fever two days ago.  She has tried tea and dayquil with no relief for her symptoms. She denies symptoms of acid reflux. She denies SOB, wheezing, trouble swallowing, nausea, abdominal pain, neck pain, rashes, and any other symptoms.    The history is provided by the patient. No language interpreter was used.    History reviewed. No pertinent past medical history.  There are no active problems to display for this patient.   Past Surgical History:  Procedure Laterality Date  . CHOLECYSTECTOMY      OB History    No data available       Home Medications    Prior to Admission medications   Medication Sig Start Date End Date Taking? Authorizing Provider  benzonatate (TESSALON) 100 MG capsule Take 1 capsule (100 mg total) by mouth 3 (three) times daily as needed for cough. 12/12/16   Stacey FarrierWilliam Leonell Lobdell, PA-C  cetirizine (ZYRTEC ALLERGY) 10 MG tablet Take 1 tablet (10 mg total) by mouth daily. 12/12/16   Stacey FarrierWilliam Suheyla Mortellaro, PA-C  fluticasone (FLONASE) 50 MCG/ACT nasal spray Place 2 sprays into both nostrils daily. 12/12/16   Stacey FarrierWilliam Stacey Juday, PA-C    Family History No family history on file.  Social History Social History  Substance Use  Topics  . Smoking status: Never Smoker  . Smokeless tobacco: Never Used  . Alcohol use No     Allergies   Patient has no known allergies.   Review of Systems Review of Systems  Constitutional: Negative for chills and fever (subjective).  HENT: Positive for congestion, ear pain, postnasal drip, rhinorrhea, sneezing and voice change. Negative for mouth sores and trouble swallowing.        +Ear pressure  Respiratory: Positive for cough (productive, green sputum). Negative for shortness of breath and wheezing.        +Chest wall pain with cough  Cardiovascular: Negative for chest pain.  Gastrointestinal: Negative for abdominal pain and nausea.  Musculoskeletal: Positive for myalgias. Negative for neck pain and neck stiffness.  Skin: Negative for rash and wound.  Neurological: Negative for syncope and light-headedness.    Physical Exam Updated Vital Signs BP 103/59 (BP Location: Right Arm)   Pulse 75   Temp 98.6 F (37 C) (Oral)   Resp 22   SpO2 100%   Physical Exam  Constitutional: She appears well-developed and well-nourished. No distress.  HENT:  Head: Normocephalic and atraumatic.  Right Ear: External ear normal. Tympanic membrane is not erythematous. A middle ear effusion is present.  Left Ear: External ear normal. Tympanic membrane is not erythematous. A middle ear effusion is present.  Mouth/Throat: Uvula is midline, oropharynx is clear and moist and mucous membranes are normal.  Mild middle ear effusion bilaterally. No loss of landmarks. No TM  erythema. Boggy nasal turbinates bilaterally. Rhinorrhea present.  Eyes: Conjunctivae are normal. Pupils are equal, round, and reactive to light. Right eye exhibits no discharge. Left eye exhibits no discharge.  Neck: Normal range of motion. Neck supple. No JVD present.  Cardiovascular: Normal rate, regular rhythm, normal heart sounds and intact distal pulses.  Exam reveals no gallop and no friction rub.   No murmur  heard. Pulmonary/Chest: Effort normal and breath sounds normal. No stridor. No respiratory distress. She has no wheezes. She has no rales.  Lungs clear to auscultation bilaterally. No increased work of breathing. No rales or rhonchi.  Abdominal: Soft. There is no tenderness. There is no guarding.  Lymphadenopathy:    She has no cervical adenopathy.  Neurological: She is alert. Coordination normal.  Skin: Skin is warm and dry. Capillary refill takes less than 2 seconds. No rash noted. She is not diaphoretic. No erythema. No pallor.  Psychiatric: She has a normal mood and affect. Her behavior is normal.  Nursing note and vitals reviewed.    ED Treatments / Results  DIAGNOSTIC STUDIES: Oxygen Saturation is 100% on RA, nl by my interpretation.    COORDINATION OF CARE: 10:02 PM Discussed treatment plan with pt at bedside which includes CXR, decadron, flonase Rx, cetirizine Rx, tessalon perles Rx,  and pt agreed to plan.    Radiology Dg Chest 2 View  Result Date: 12/12/2016 CLINICAL DATA:  Cough for 1 month, intermittent fever, shortness of breath and back pain. EXAM: CHEST  2 VIEW COMPARISON:  Chest radiograph June 12, 2007 FINDINGS: Cardiomediastinal silhouette is normal. No pleural effusions or focal consolidations. Trachea projects midline and there is no pneumothorax. Soft tissue planes and included osseous structures are non-suspicious. Mild degenerative change of thoracic spine. Surgical clips in the included right abdomen compatible with cholecystectomy. IMPRESSION: Normal chest. Electronically Signed   By: Stacey Blackwell M.D.   On: 12/12/2016 21:44    Procedures Procedures (including critical care time)  Medications Ordered in ED Medications  dexamethasone (DECADRON) tablet 10 mg (not administered)  acetaminophen (TYLENOL) tablet 650 mg (not administered)     Initial Impression / Assessment and Plan / ED Course  I have reviewed the triage vital signs and the nursing  notes.  Pertinent imaging results that were available during my care of the patient were reviewed by me and considered in my medical decision making (see chart for details).  Clinical Course    Patient presents with one month of cough, sneezing, nasal congestion, postnasal drip. She reports a fever subjectively 2 days ago. No fever today. On exam the patient is afebrile nontoxic appearing. No hypoxia. No tachycardia or tachypnea. Lungs are clear to auscultation bilaterally. No rales or rhonchi. No increased work of breathing. Chest x-ray is unremarkable. Patient with rhinorrhea present. Evidence of sinus congestion. Suspect the patient's cough is due to her postnasal drip and URI. Stacey start the patient on Flonase, Zyrtec, and Tessalon Perles. Decadron in the emergency department. I discussed return precautions. I advised the patient to follow-up with their primary care provider this week. I advised the patient to return to the emergency department with new or worsening symptoms or new concerns. The patient verbalized understanding and agreement with plan.    Final Clinical Impressions(s) / ED Diagnoses   Final diagnoses:  Viral URI with cough  Acute allergic rhinitis, unspecified seasonality, unspecified trigger    New Prescriptions New Prescriptions   BENZONATATE (TESSALON) 100 MG CAPSULE    Take 1 capsule (100  mg total) by mouth 3 (three) times daily as needed for cough.   CETIRIZINE (ZYRTEC ALLERGY) 10 MG TABLET    Take 1 tablet (10 mg total) by mouth daily.   FLUTICASONE (FLONASE) 50 MCG/ACT NASAL SPRAY    Place 2 sprays into both nostrils daily.   I personally performed the services described in this documentation, which was scribed in my presence. The recorded information has been reviewed and is accurate.       Stacey FarrierWilliam Kaniya Trueheart, PA-C 12/12/16 2231    Loren Raceravid Yelverton, MD 12/13/16 [phone]

## 2017-03-04 ENCOUNTER — Encounter (HOSPITAL_COMMUNITY): Payer: Self-pay | Admitting: Emergency Medicine

## 2017-03-04 ENCOUNTER — Ambulatory Visit (INDEPENDENT_AMBULATORY_CARE_PROVIDER_SITE_OTHER): Payer: Worker's Compensation

## 2017-03-04 ENCOUNTER — Ambulatory Visit (HOSPITAL_COMMUNITY)
Admission: EM | Admit: 2017-03-04 | Discharge: 2017-03-04 | Disposition: A | Discharge status: Home / Self Care | Payer: Worker's Compensation | Attending: Emergency Medicine | Admitting: Emergency Medicine

## 2017-03-04 DIAGNOSIS — X509XXA Other and unspecified overexertion or strenuous movements or postures, initial encounter: Secondary | ICD-10-CM

## 2017-03-04 DIAGNOSIS — S4991XA Unspecified injury of right shoulder and upper arm, initial encounter: Secondary | ICD-10-CM

## 2017-03-04 MED ORDER — ACETAMINOPHEN 325 MG PO TABS
650.0000 mg | ORAL_TABLET | Freq: Once | ORAL | Status: AC
  Administered 2017-03-04: 650 mg via ORAL

## 2017-03-04 MED ORDER — HYDROCODONE-ACETAMINOPHEN 5-325 MG PO TABS
ORAL_TABLET | ORAL | Status: AC
  Filled 2017-03-04: qty 1

## 2017-03-04 MED ORDER — ACETAMINOPHEN 325 MG PO TABS
ORAL_TABLET | ORAL | Status: AC
  Filled 2017-03-04: qty 2

## 2017-03-04 MED ORDER — HYDROCODONE-ACETAMINOPHEN 5-325 MG PO TABS
1.0000 | ORAL_TABLET | Freq: Once | ORAL | Status: AC
  Administered 2017-03-04: 1 via ORAL

## 2017-03-04 MED ORDER — MELOXICAM 15 MG PO TABS: 15.0000 mg | ORAL_TABLET | Freq: Every day | ORAL | 1 refills | Status: DC

## 2017-03-04 NOTE — ED Triage Notes (Signed)
Patient reports this is work related and supervisor is aware per patient.  Pain in right wrist.  Noticed pain when pulling on a box that was above her head.  States nothing fell on hand.  Able to move fingers, right index and middle finger have altered sensation, right radial pulse 2 plus

## 2017-03-04 NOTE — Discharge Instructions (Signed)
Your x-ray did not show any acute fractures, or dislocations. He most likely have a muscle strain, or sprain. We have placed her wrist in a splint here in clinic today, you've been given medication for pain. I sent a prescription to her pharmacy for a medicine called Mobic, take one tablet daily. Do not take any over-the-counter anti-inflammatories such as ibuprofen, Motrin, Aleve, naproxen, or products containing these medications while taking Mobic. You may take over-the-counter Tylenol every 4-6 hours as needed for additional pain control not to exceed 4000 mg a day. Should your symptoms fail to resolve, follow up with her occupational health department for further evaluation.

## 2017-03-04 NOTE — ED Provider Notes (Signed)
CSN: [id number]     Arrival date & time 03/04/17  1133 History   None    Chief Complaint  Patient presents with  . Arm Injury   (Consider location/radiation/quality/duration/timing/severity/associated sxs/prior Treatment) 41 year old female presents to clinic with a chief complaint of right wrist pain. She states that injury occurred yesterday while at work. She states she was lifting a heavy box above her head, when she felt something "give way" in her arm. She states she has a throbbing, aching pain in right forearm and wrist. She denies any numbness, tingling, or loss of sensation is still to the injury, states she is able to form a grip, she is able to move her hand freely, however there is swelling in the forearm. She denies any other complaint at this time   The history is provided by the patient.    History reviewed. No pertinent past medical history. Past Surgical History:  Procedure Laterality Date  . CHOLECYSTECTOMY     No family history on file. Social History  Substance Use Topics  . Smoking status: Never Smoker  . Smokeless tobacco: Never Used  . Alcohol use No   OB History    No data available     Review of Systems  Reason unable to perform ROS: As covered in history of present illness.  All other systems reviewed and are negative.   Allergies  Patient has no known allergies.  Home Medications   Prior to Admission medications   Medication Sig Start Date End Date Taking? Authorizing Provider  benzonatate (TESSALON) 100 MG capsule Take 1 capsule (100 mg total) by mouth 3 (three) times daily as needed for cough. Patient not taking: Reported on 03/04/2017 12/12/16   Everlene Farrier, PA-C  cetirizine (ZYRTEC ALLERGY) 10 MG tablet Take 1 tablet (10 mg total) by mouth daily. Patient not taking: Reported on 03/04/2017 12/12/16   Everlene Farrier, PA-C  fluticasone [facility]) 50 MCG/ACT nasal spray Place 2 sprays into both nostrils daily. Patient not taking: Reported on  03/04/2017 12/12/16   Everlene Farrier, PA-C  meloxicam (MOBIC) 15 MG tablet Take 1 tablet (15 mg total) by mouth daily. 03/04/17   Dorena Bodo, NP   Meds Ordered and Administered this Visit   Medications  HYDROcodone-acetaminophen (NORCO/VICODIN) 5-325 MG per tablet 1 tablet (1 tablet Oral Given 03/04/17 1402)  acetaminophen (TYLENOL) tablet 650 mg (650 mg Oral Given 03/04/17 1401)    BP 114/75 (BP Location: Left Arm) Comment (BP Location): large cuff  Pulse 62   Temp 98.3 F (36.8 C) (Oral)   Resp 18   LMP 03/04/2017   SpO2 100%  No data found.   Physical Exam  Constitutional: She is oriented to person, place, and time. She appears well-developed and well-nourished. No distress.  HENT:  Head: Normocephalic and atraumatic.  Right Ear: External ear normal.  Left Ear: External ear normal.  Musculoskeletal: She exhibits tenderness. She exhibits no edema.       Right wrist: She exhibits tenderness and swelling. She exhibits no crepitus and no deformity.  Neurological: She is alert and oriented to person, place, and time.  Skin: Skin is warm and dry. Capillary refill takes less than 2 seconds. No rash noted. She is not diaphoretic. No erythema.  Psychiatric: She has a normal mood and affect. Her behavior is normal.  Nursing note and vitals reviewed.   Urgent Care Course     Procedures (including critical care time)  Labs Review Labs Reviewed - No data to display  Imaging Review Dg Wrist Complete Right  Result Date: 03/04/2017 CLINICAL DATA:  Lifting injury. EXAM: RIGHT WRIST - COMPLETE 3+ VIEW COMPARISON:  No prior . FINDINGS: No acute bony or joint abnormality identified. No evidence of fracture or dislocation . IMPRESSION: No acute or focal abnormality. Electronically Signed   By: Maisie Fushomas  Register   On: 03/04/2017 13:36        MDM   1. Injury of right upper extremity, initial encounter    Your x-ray did not show any acute fractures, or dislocations. He most likely  have a muscle strain, or sprain. We have placed her wrist in a splint here in clinic today, you've been given medication for pain. I sent a prescription to her pharmacy for a medicine called Mobic, take one tablet daily. Do not take any over-the-counter anti-inflammatories such as ibuprofen, Motrin, Aleve, naproxen, or products containing these medications while taking Mobic. You may take over-the-counter Tylenol every 4-6 hours as needed for additional pain control not to exceed 4000 mg a day. Should your symptoms fail to resolve, follow up with her occupational health department for further evaluation.      Dorena BodoLawrence Aayana Reinertsen, NP 03/04/17 1421

## 2018-04-24 ENCOUNTER — Emergency Department (HOSPITAL_COMMUNITY)
Admission: EM | Admit: 2018-04-24 | Discharge: 2018-04-24 | Disposition: A | Discharge status: Home / Self Care | Payer: Self-pay | Attending: Emergency Medicine | Admitting: Emergency Medicine

## 2018-04-24 ENCOUNTER — Emergency Department (HOSPITAL_COMMUNITY): Payer: Self-pay

## 2018-04-24 ENCOUNTER — Encounter (HOSPITAL_COMMUNITY): Payer: Self-pay | Admitting: *Deleted

## 2018-04-24 DIAGNOSIS — G44209 Tension-type headache, unspecified, not intractable: Secondary | ICD-10-CM

## 2018-04-24 DIAGNOSIS — Z79899 Other long term (current) drug therapy: Secondary | ICD-10-CM | POA: Insufficient documentation

## 2018-04-24 LAB — COMPREHENSIVE METABOLIC PANEL
ALT: 23 U/L (ref 14–54)
AST: 32 U/L (ref 15–41)
Albumin: 4 g/dL (ref 3.5–5.0)
Alkaline Phosphatase: 74 U/L (ref 38–126)
Anion gap: 13 (ref 5–15)
BUN: 9 mg/dL (ref 6–20)
CO2: 22 mmol/L (ref 22–32)
Calcium: 9 mg/dL (ref 8.9–10.3)
Chloride: 103 mmol/L (ref 101–111)
Creatinine, Ser: 0.72 mg/dL (ref 0.44–1.00)
GFR calc Af Amer: >60 mL/min (ref >60)
GFR calc non Af Amer: >60 mL/min (ref >60)
Glucose, Bld: 80 mg/dL (ref 65–99)
Potassium: 3.7 mmol/L (ref 3.5–5.1)
Sodium: 138 mmol/L (ref 135–145)
Total Bilirubin: 0.7 mg/dL (ref 0.3–1.2)
Total Protein: 7.6 g/dL (ref 6.5–8.1)

## 2018-04-24 LAB — URINALYSIS, ROUTINE W REFLEX MICROSCOPIC
Bilirubin Urine: NEGATIVE
Glucose, UA: NEGATIVE mg/dL
Hgb urine dipstick: NEGATIVE
Ketones, ur: NEGATIVE mg/dL
Nitrite: NEGATIVE
Protein, ur: NEGATIVE mg/dL
Specific Gravity, Urine: 1.005 (ref 1.005–1.030)
pH: 6 (ref 5.0–8.0)

## 2018-04-24 LAB — CBC WITH DIFFERENTIAL/PLATELET
Basophils Absolute: 0 10*3/uL (ref 0.0–0.1)
Basophils Relative: 0 %
Eosinophils Absolute: 0.2 10*3/uL (ref 0.0–0.7)
Eosinophils Relative: 3 %
HCT: 35.4 % — ABNORMAL LOW (ref 36.0–46.0)
Hemoglobin: 11.2 g/dL — ABNORMAL LOW (ref 12.0–15.0)
Lymphocytes Relative: 36 %
Lymphs Abs: 3 10*3/uL (ref 0.7–4.0)
MCH: 23.2 pg — ABNORMAL LOW (ref 26.0–34.0)
MCHC: 31.6 g/dL (ref 30.0–36.0)
MCV: 73.4 fL — ABNORMAL LOW (ref 78.0–100.0)
Monocytes Absolute: 0.7 10*3/uL (ref 0.1–1.0)
Monocytes Relative: 8 %
Neutro Abs: 4.4 10*3/uL (ref 1.7–7.7)
Neutrophils Relative %: 53 %
Platelets: 365 10*3/uL (ref 150–400)
RBC: 4.82 MIL/uL (ref 3.87–5.11)
RDW: 15.9 % — ABNORMAL HIGH (ref 11.5–15.5)
WBC: 8.3 10*3/uL (ref 4.0–10.5)

## 2018-04-24 LAB — RAPID URINE DRUG SCREEN, HOSP PERFORMED
Amphetamines: NOT DETECTED
Barbiturates: NOT DETECTED
Benzodiazepines: NOT DETECTED
Cocaine: NOT DETECTED
Opiates: NOT DETECTED
Tetrahydrocannabinol: NOT DETECTED

## 2018-04-24 LAB — I-STAT TROPONIN, ED: Troponin i, poc: 0 ng/mL (ref 0.00–0.08)

## 2018-04-24 LAB — I-STAT BETA HCG BLOOD, ED (MC, WL, AP ONLY): I-stat hCG, quantitative: <5 m[IU]/mL (ref <5)

## 2018-04-24 LAB — ETHANOL: Alcohol, Ethyl (B): <10 mg/dL (ref <10)

## 2018-04-24 MED ORDER — DIPHENHYDRAMINE HCL 25 MG PO CAPS
25.0000 mg | ORAL_CAPSULE | Freq: Once | ORAL | Status: AC
  Administered 2018-04-24: 25 mg via ORAL
  Filled 2018-04-24: qty 1

## 2018-04-24 MED ORDER — PROCHLORPERAZINE EDISYLATE 10 MG/2ML IJ SOLN
10.0000 mg | Freq: Once | INTRAMUSCULAR | Status: AC
  Administered 2018-04-24: 10 mg via INTRAVENOUS
  Filled 2018-04-24: qty 2

## 2018-04-24 NOTE — ED Notes (Signed)
Pt reports dizziness and neck pain, states she drank an energy drink and symptoms started a few hours later, no history of same, pt states she feels drink and has tingling in her left hand, denies consumption of any ETOH or drugs, pt states she was at work

## 2018-04-24 NOTE — ED Notes (Signed)
ED Provider at bedside. 

## 2018-04-24 NOTE — ED Notes (Signed)
Signature pad unavailable at time of discharge. Pt verbalized understanding of instructions. Pt given work note.

## 2018-04-24 NOTE — ED Triage Notes (Signed)
Pt in via EMS c/o headache that started earlier today, pt denies a history of headaches like this in the past, alert and oriented, no distress noted

## 2018-04-24 NOTE — ED Provider Notes (Signed)
Patient placed in Quick Look pathway, seen and evaluated   Chief Complaint: Headache  HPI:   Stacey Blackwell is a 42 y.o. female, presenting to the ED with headache that started around 12:30 PM this afternoon.  Headache is mostly left-sided, extends from the frontal region into the occipital region and radiates down the neck.  She has not had this type of headache before. States she drank a [employer] energy drink around 11:30 AM this morning.  After her headache began, she asked 1 of her coworkers for medication for it.  States she was given two Aleve.  Patient states, "I just feel like I am drunk."  Denies alcohol consumption or drug use.  Denies unilateral weakness, numbness, vomiting, diarrhea, chest pain, shortness of breath, abdominal pain, falls/trauma, or vision abnormalities.   ROS: Headache (one)  Physical Exam:   Gen: No distress. Smiling, slouching in the chair, sits up straight when asked to do so.  Neuro: Awake and Alert  Skin: Warm    Focused Exam:   No diaphoresis.  No pallor.  Pulmonary: No increased work of breathing.  Speaks in full sentences without difficulty.  Lung sounds clear.  No tachypnea.  Cardiac: Normal rate and regular. Peripheral pulses intact.  Abdominal: No abdominal tenderness.  No peritoneal signs.  No rebound tenderness.  No guarding.   Neurologic:  A&Ox4.  No sensory deficits.  No noted speech deficits. No aphasia. Patient handles oral secretions without difficulty. No noted swallowing defects. Equal grip strength bilaterally. Strength 5/5 in the upper extremities. Strength 5/5 in lower extremities.  Overall behaves as if her body is heavier than normal. No gait disturbance.  Coordination intact with finger to nose testing.  Cranial nerves III-XII grossly intact.  No facial droop.   Patient giggles during exam.   MSK: No peripheral edema.   Initiation of care has begun. The patient has been counseled on the process, plan, and  necessity for staying for the completion/evaluation, and the remainder of the medical screening examination   Stacey Blackwell 04/24/18 1827    Stacey Loveless, MD 04/25/18 [phone]

## 2018-04-24 NOTE — ED Provider Notes (Signed)
[facility] EMERGENCY DEPARTMENT Provider Note   CSN: [id number] Arrival date & time: 04/24/18  1713     History   Chief Complaint Chief Complaint  Patient presents with  . Headache    HPI Stacey Blackwell is a 42 y.o. female.  The history is provided by the patient.  Headache   This is a new problem. The current episode started 3 to 5 hours ago. The problem occurs hourly. The problem has been gradually improving. The headache is associated with nothing. The pain is located in the left unilateral region. The quality of the pain is described as dull. The pain is at a severity of 4/10. The pain is moderate. The pain does not radiate. Pertinent negatives include no anorexia, no fever, no malaise/fatigue, no chest pressure, no near-syncope, no orthopnea, no palpitations, no syncope, no shortness of breath, no nausea and no vomiting. She has tried nothing for the symptoms. The treatment provided no relief.    History reviewed. No pertinent past medical history.  There are no active problems to display for this patient.   Past Surgical History:  Procedure Laterality Date  . CHOLECYSTECTOMY       OB History   None      Home Medications    Prior to Admission medications   Medication Sig Start Date End Date Taking? Authorizing Provider  meloxicam (MOBIC) 15 MG tablet Take 1 tablet (15 mg total) by mouth daily. 03/04/17  Yes Dorena Bodo, NP  benzonatate (TESSALON) 100 MG capsule Take 1 capsule (100 mg total) by mouth 3 (three) times daily as needed for cough. Patient not taking: Reported on 03/04/2017 12/12/16   Everlene Farrier, PA-C  cetirizine (ZYRTEC ALLERGY) 10 MG tablet Take 1 tablet (10 mg total) by mouth daily. Patient not taking: Reported on 03/04/2017 12/12/16   Everlene Farrier, PA-C  fluticasone [facility]) 50 MCG/ACT nasal spray Place 2 sprays into both nostrils daily. Patient not taking: Reported on 03/04/2017 12/12/16   Everlene Farrier,  PA-C    Family History History reviewed. No pertinent family history.  Social History Social History   Tobacco Use  . Smoking status: Never Smoker  . Smokeless tobacco: Never Used  Substance Use Topics  . Alcohol use: No  . Drug use: No     Allergies   Patient has no known allergies.   Review of Systems Review of Systems  Constitutional: Negative for chills, fever and malaise/fatigue.  HENT: Negative for ear pain and sore throat.   Eyes: Negative for pain and visual disturbance.  Respiratory: Negative for cough and shortness of breath.   Cardiovascular: Negative for chest pain, palpitations, orthopnea, syncope and near-syncope.  Gastrointestinal: Negative for abdominal pain, anorexia, nausea and vomiting.  Genitourinary: Negative for dysuria and hematuria.  Musculoskeletal: Negative for arthralgias and back pain.  Skin: Negative for color change and rash.  Neurological: Positive for headaches. Negative for dizziness, tremors, seizures, syncope, facial asymmetry, speech difficulty, weakness, light-headedness and numbness.  All other systems reviewed and are negative.    Physical Exam Updated Vital Signs  ED Triage Vitals [04/24/18 1713]  Enc Vitals Group     BP 109/69     Pulse Rate 65     Resp 16     Temp 99.3 F (37.4 C)     Temp Source Oral     SpO2 100 %     Weight      Height      Head Circumference  Peak Flow      Pain Score      Pain Loc      Pain Edu?      Excl. in GC?     Physical Exam  Constitutional: She is oriented to person, place, and time. She appears well-developed and well-nourished. No distress.  HENT:  Head: Normocephalic and atraumatic.  Eyes: Pupils are equal, round, and reactive to light. Conjunctivae and EOM are normal. Right eye exhibits normal extraocular motion and no nystagmus. Left eye exhibits normal extraocular motion and no nystagmus. Right pupil is round and reactive. Left pupil is round and reactive.  Neck: Normal  range of motion. Neck supple. No neck rigidity. No tracheal deviation present.  Cardiovascular: Normal rate, regular rhythm, normal heart sounds and intact distal pulses.  No murmur heard. Pulmonary/Chest: Effort normal and breath sounds normal. No respiratory distress.  Abdominal: Soft. Bowel sounds are normal. There is no tenderness.  Musculoskeletal: She exhibits no edema.  Neurological: She is alert and oriented to person, place, and time. She has normal strength. No cranial nerve deficit or sensory deficit (normal sensation throughout). Coordination and gait normal.  Normal finger to nose finger, no drift  Skin: Skin is warm and dry.  Psychiatric: She has a normal mood and affect.  Nursing note and vitals reviewed.    ED Treatments / Results  Labs (all labs ordered are listed, but only abnormal results are displayed) Labs Reviewed  CBC WITH DIFFERENTIAL/PLATELET - Abnormal; Notable for the following components:      Result Value   Hemoglobin 11.2 (*)    HCT 35.4 (*)    MCV 73.4 (*)    MCH 23.2 (*)    RDW 15.9 (*)    All other components within normal limits  URINALYSIS, ROUTINE W REFLEX MICROSCOPIC - Abnormal; Notable for the following components:   APPearance HAZY (*)    Leukocytes, UA SMALL (*)    Bacteria, UA RARE (*)    All other components within normal limits  COMPREHENSIVE METABOLIC PANEL  ETHANOL  RAPID URINE DRUG SCREEN, HOSP PERFORMED  I-STAT TROPONIN, ED  I-STAT BETA HCG BLOOD, ED ([facility], [facility], [facility] ONLY)    EKG EKG Interpretation  Date/Time:  Thursday Apr 24 2018 17:28:26 EDT Ventricular Rate:  69 PR Interval:  174 QRS Duration: 82 QT Interval:  384 QTC Calculation: 411 R Axis:   86 Text Interpretation:  Normal sinus rhythm Nonspecific T wave abnormality Abnormal ECG no significant change since 2009 Confirmed by [name] [phone]) on 04/24/2018 8:07:03 PM   Radiology Dg Chest 2 View  Result Date: 04/24/2018 CLINICAL DATA:  Weakness EXAM: CHEST - 2  VIEW COMPARISON:  12/12/2016 FINDINGS: The lungs are clear without focal pneumonia, edema, pneumothorax or pleural effusion. The cardiopericardial silhouette is within normal limits for size. The visualized bony structures of the thorax are intact. IMPRESSION: No active cardiopulmonary disease. Electronically Signed   By: Kennith Center M.D.   On: 04/24/2018 18:11   Ct Head Wo Contrast  Result Date: 04/24/2018 CLINICAL DATA:  Headache EXAM: CT HEAD WITHOUT CONTRAST TECHNIQUE: Contiguous axial images were obtained from the base of the skull through the vertex without intravenous contrast. COMPARISON:  None. FINDINGS: Brain: No evidence of acute infarction, hemorrhage, hydrocephalus, extra-axial collection or mass lesion/mass effect. Vascular: No hyperdense vessel or unexpected calcification. Skull: Normal. Negative for fracture or focal lesion. Sinuses/Orbits: The visualized paranasal sinuses are essentially clear. The mastoid air cells are unopacified. Other: None. IMPRESSION: Normal head CT.  Electronically Signed   By: Charline Bills M.D.   On: 04/24/2018 19:20    Procedures Procedures (including critical care time)  Medications Ordered in ED Medications  prochlorperazine (COMPAZINE) injection 10 mg (10 mg Intravenous Given 04/24/18 2057)  diphenhydrAMINE (BENADRYL) capsule 25 mg (25 mg Oral Given 04/24/18 2057)     Initial Impression / Assessment and Plan / ED Course  I have reviewed the triage vital signs and the nursing notes.  Pertinent labs & imaging results that were available during my care of the patient were reviewed by me and considered in my medical decision making (see chart for details).     Stacey Blackwell is a 42 year old female with no significant medical history who presents to the ED with left-sided headache.  Patient states that pain started from the back of the left side of the neck and radiates to the left side of her head.  Headache started gradually and slowly  got worse. Patient denies any trauma.  Denies any fever.  Patient took some Aleve prior to arrival.  Patient denies any weakness, numbness, tingling.  Patient with no vision changes.  No history of prior headaches.  Patient denies any loss of consciousness, chest pain, shortness of breath.  Patient was seen in the first look process and had lab work done and imaging done prior to my evaluation.  Upon my evaluation patient has normal neurological exam.  Normal gait.  Patient overall with normal behavior.  Patient with history and physical that is consistent with tension headache.  Patient has no signs to suggest meningitis.  Patient already with lab work and imaging that was fairly unremarkable. Head CT showed no acute processes. No concern for subarachnoid hemorrhage given history and physical.  Patient with normal alcohol level.  Negative UDS.  No significant electrolyte abnormality, acute kidney injury or leukocytosis.  Patient has no infectious symptoms.  Patient with negative urinalysis.  Etiology of headache at this time is likely tension or migraine in etiology.  Patient was given IV Compazine and p.o. Benadryl and had full relief of symptoms.  Patient was discharged from ED in good condition and recommend Tylenol/Motrin for any other headaches at this time.  Told to return to the ED if symptoms worsen.  I recommend follow-up with primary care provider.  Final Clinical Impressions(s) / ED Diagnoses   Final diagnoses:  Tension headache    ED Discharge Orders    None       Virgina Norfolk, DO 04/24/18 2247    [name], MD 04/25/18 0005

## 2019-12-23 ENCOUNTER — Other Ambulatory Visit: Payer: Self-pay | Admitting: *Deleted

## 2019-12-23 DIAGNOSIS — Z20822 Contact with and (suspected) exposure to covid-19: Secondary | ICD-10-CM

## 2020-03-03 ENCOUNTER — Encounter (HOSPITAL_BASED_OUTPATIENT_CLINIC_OR_DEPARTMENT_OTHER): Payer: Self-pay | Admitting: Emergency Medicine

## 2020-03-03 ENCOUNTER — Emergency Department (HOSPITAL_BASED_OUTPATIENT_CLINIC_OR_DEPARTMENT_OTHER)
Admission: EM | Admit: 2020-03-03 | Discharge: 2020-03-04 | Disposition: A | Discharge status: Home / Self Care | Payer: Managed Care, Other (non HMO) | Attending: Emergency Medicine | Admitting: Emergency Medicine

## 2020-03-03 ENCOUNTER — Other Ambulatory Visit: Payer: Self-pay

## 2020-03-03 DIAGNOSIS — Z9049 Acquired absence of other specified parts of digestive tract: Secondary | ICD-10-CM | POA: Diagnosis not present

## 2020-03-03 DIAGNOSIS — R1013 Epigastric pain: Secondary | ICD-10-CM | POA: Diagnosis not present

## 2020-03-03 NOTE — ED Triage Notes (Signed)
Epigastric pain for 1 week.

## 2020-03-04 LAB — COMPREHENSIVE METABOLIC PANEL
ALT: 51 U/L — ABNORMAL HIGH (ref 0–44)
AST: 36 U/L (ref 15–41)
Albumin: 4.4 g/dL (ref 3.5–5.0)
Alkaline Phosphatase: 73 U/L (ref 38–126)
Anion gap: 11 (ref 5–15)
BUN: 13 mg/dL (ref 6–20)
CO2: 24 mmol/L (ref 22–32)
Calcium: 9.5 mg/dL (ref 8.9–10.3)
Chloride: 103 mmol/L (ref 98–111)
Creatinine, Ser: 0.76 mg/dL (ref 0.44–1.00)
GFR calc Af Amer: >60 mL/min (ref >60)
GFR calc non Af Amer: >60 mL/min (ref >60)
Glucose, Bld: 94 mg/dL (ref 70–99)
Potassium: 3.6 mmol/L (ref 3.5–5.1)
Sodium: 138 mmol/L (ref 135–145)
Total Bilirubin: 0.6 mg/dL (ref 0.3–1.2)
Total Protein: 8.1 g/dL (ref 6.5–8.1)

## 2020-03-04 LAB — URINALYSIS, MICROSCOPIC (REFLEX): RBC / HPF: NONE SEEN RBC/hpf (ref 0–5)

## 2020-03-04 LAB — CBC WITH DIFFERENTIAL/PLATELET
Abs Immature Granulocytes: 0.01 10*3/uL (ref 0.00–0.07)
Basophils Absolute: 0 10*3/uL (ref 0.0–0.1)
Basophils Relative: 1 %
Eosinophils Absolute: 0.3 10*3/uL (ref 0.0–0.5)
Eosinophils Relative: 3 %
HCT: 41.1 % (ref 36.0–46.0)
Hemoglobin: 13.9 g/dL (ref 12.0–15.0)
Immature Granulocytes: 0 %
Lymphocytes Relative: 44 %
Lymphs Abs: 3.8 10*3/uL (ref 0.7–4.0)
MCH: 27.5 pg (ref 26.0–34.0)
MCHC: 33.8 g/dL (ref 30.0–36.0)
MCV: 81.2 fL (ref 80.0–100.0)
Monocytes Absolute: 0.7 10*3/uL (ref 0.1–1.0)
Monocytes Relative: 9 %
Neutro Abs: 3.6 10*3/uL (ref 1.7–7.7)
Neutrophils Relative %: 43 %
Platelets: 382 10*3/uL (ref 150–400)
RBC: 5.06 MIL/uL (ref 3.87–5.11)
RDW: 13.2 % (ref 11.5–15.5)
WBC: 8.4 10*3/uL (ref 4.0–10.5)
nRBC: 0 % (ref 0.0–0.2)

## 2020-03-04 LAB — URINALYSIS, ROUTINE W REFLEX MICROSCOPIC
Bilirubin Urine: NEGATIVE
Glucose, UA: NEGATIVE mg/dL
Hgb urine dipstick: NEGATIVE
Ketones, ur: NEGATIVE mg/dL
Nitrite: NEGATIVE
Protein, ur: NEGATIVE mg/dL
Specific Gravity, Urine: <1.005 — ABNORMAL LOW (ref 1.005–1.030)
pH: 7.5 (ref 5.0–8.0)

## 2020-03-04 LAB — PREGNANCY, URINE: Preg Test, Ur: NEGATIVE

## 2020-03-04 LAB — LIPASE, BLOOD: Lipase: 41 U/L (ref 11–51)

## 2020-03-04 MED ORDER — SUCRALFATE 1 G PO TABS: 1.0000 g | ORAL_TABLET | Freq: Three times a day (TID) | ORAL | 1 refills | Status: DC

## 2020-03-04 MED ORDER — SUCRALFATE 1 GM/10ML PO SUSP
1.0000 g | Freq: Once | ORAL | Status: AC
  Administered 2020-03-04: 01:00:00 1 g via ORAL
  Filled 2020-03-04: qty 10

## 2020-03-04 MED ORDER — ONDANSETRON HCL 4 MG/2ML IJ SOLN
4.0000 mg | Freq: Once | INTRAMUSCULAR | Status: AC
  Administered 2020-03-04: 4 mg via INTRAVENOUS
  Filled 2020-03-04: qty 2

## 2020-03-04 MED ORDER — ESOMEPRAZOLE MAGNESIUM 40 MG PO CPDR: DELAYED_RELEASE_CAPSULE | ORAL | 0 refills | Status: DC

## 2020-03-04 MED ORDER — PANTOPRAZOLE SODIUM 40 MG IV SOLR
40.0000 mg | Freq: Once | INTRAVENOUS | Status: AC
Start: 2020-03-04 — End: 2020-03-04
  Administered 2020-03-04: 40 mg via INTRAVENOUS
  Filled 2020-03-04: qty 40

## 2020-03-04 NOTE — ED Provider Notes (Signed)
[facility] Provider Note: Lowella Dell, MD, FACEP  CSN: [id number] MRN: [id number] ARRIVAL: 03/03/20 at 2342 ROOM: MH01/[facility]01   CHIEF COMPLAINT  Abdominal Pain   HISTORY OF PRESENT ILLNESS  03/04/20 12:02 AM Stacey Blackwell is a 44 y.o. female with a 1 week history of epigastric pain.  The pain has come on gradually and is now an 8 out of 10.  It is worse with breathing, palpation, movement, and especially with eating.  She denies nausea or vomiting but has had diarrhea for 3 days.  She is status post cholecystectomy.  She feels short of breath due to pain with breathing.   History reviewed. No pertinent past medical history.  Past Surgical History:  Procedure Laterality Date  . CHOLECYSTECTOMY      No family history on file.  Social History   Tobacco Use  . Smoking status: Never Smoker  . Smokeless tobacco: Never Used  Substance Use Topics  . Alcohol use: No  . Drug use: No    Prior to Admission medications   Medication Sig Start Date End Date Taking? Authorizing Provider  esomeprazole (NEXIUM) 40 MG capsule Take 1 capsule daily at least 30 minutes before first dose of Carafate. 03/04/20   Bronwyn Belasco, MD  sucralfate (CARAFATE) 1 g tablet Take 1 tablet (1 g total) by mouth 4 (four) times daily -  with meals and at bedtime. 03/04/20   Luwanna Brossman, MD  cetirizine (ZYRTEC ALLERGY) 10 MG tablet Take 1 tablet (10 mg total) by mouth daily. Patient not taking: Reported on 03/04/2017 12/12/16 03/03/20  Everlene Farrier, PA-C  fluticasone [facility]) 50 MCG/ACT nasal spray Place 2 sprays into both nostrils daily. Patient not taking: Reported on 03/04/2017 12/12/16 03/03/20  Everlene Farrier, PA-C    Allergies Patient has no known allergies.   REVIEW OF SYSTEMS  Negative except as noted here or in the History of Present Illness.   PHYSICAL EXAMINATION  Initial Vital Signs Blood pressure 128/72, pulse 76, temperature 98.2 F (36.8 C), temperature source  Oral, resp. rate 16, SpO2 100 %.  Examination General: Well-developed, well-nourished female in no acute distress; appearance consistent with age of record HENT: normocephalic; atraumatic Eyes: pupils equal, round and reactive to light; extraocular muscles intact Neck: supple Heart: regular rate and rhythm Lungs: clear to auscultation bilaterally Abdomen: soft; nondistended; epigastric and left upper quadrant tenderness; no masses or hepatosplenomegaly; bowel sounds present Extremities: No deformity; full range of motion; pulses normal Neurologic: Awake, alert and oriented; motor function intact in all extremities and symmetric; no facial droop Skin: Warm and dry Psychiatric: Normal mood and affect   RESULTS  Summary of this visit's results, reviewed and interpreted by myself:   EKG Interpretation  Date/Time:    Ventricular Rate:    PR Interval:    QRS Duration:   QT Interval:    QTC Calculation:   R Axis:     Text Interpretation:        Laboratory Studies: Results for orders placed or performed during the hospital encounter of 03/03/20 (from the past 24 hour(s))  CBC with Differential/Platelet     Status: None   Collection Time: 03/04/20 12:07 AM  Result Value Ref Range   WBC 8.4 4.0 - 10.5 K/uL   RBC 5.06 3.87 - 5.11 MIL/uL   Hemoglobin 13.9 12.0 - 15.0 g/dL   HCT 62.9 52.8 - 41.3 %   MCV 81.2 80.0 - 100.0 fL   MCH 27.5 26.0 - 34.0 pg  MCHC 33.8 30.0 - 36.0 g/dL   RDW 54.0 08.6 - 76.1 %   Platelets 382 150 - 400 K/uL   nRBC 0.0 0.0 - 0.2 %   Neutrophils Relative % 43 %   Neutro Abs 3.6 1.7 - 7.7 K/uL   Lymphocytes Relative 44 %   Lymphs Abs 3.8 0.7 - 4.0 K/uL   Monocytes Relative 9 %   Monocytes Absolute 0.7 0.1 - 1.0 K/uL   Eosinophils Relative 3 %   Eosinophils Absolute 0.3 0.0 - 0.5 K/uL   Basophils Relative 1 %   Basophils Absolute 0.0 0.0 - 0.1 K/uL   Immature Granulocytes 0 %   Abs Immature Granulocytes 0.01 0.00 - 0.07 K/uL  Comprehensive metabolic  panel     Status: Abnormal   Collection Time: 03/04/20 12:07 AM  Result Value Ref Range   Sodium 138 135 - 145 mmol/L   Potassium 3.6 3.5 - 5.1 mmol/L   Chloride 103 98 - 111 mmol/L   CO2 24 22 - 32 mmol/L   Glucose, Bld 94 70 - 99 mg/dL   BUN 13 6 - 20 mg/dL   Creatinine, Ser 9.50 0.44 - 1.00 mg/dL   Calcium 9.5 8.9 - 93.2 mg/dL   Total Protein 8.1 6.5 - 8.1 g/dL   Albumin 4.4 3.5 - 5.0 g/dL   AST 36 15 - 41 U/L   ALT 51 (H) 0 - 44 U/L   Alkaline Phosphatase 73 38 - 126 U/L   Total Bilirubin 0.6 0.3 - 1.2 mg/dL   GFR calc non Af Amer >60 >60 mL/min   GFR calc Af Amer >60 >60 mL/min   Anion gap 11 5 - 15  Lipase, blood     Status: None   Collection Time: 03/04/20 12:07 AM  Result Value Ref Range   Lipase 41 11 - 51 U/L  Pregnancy, urine     Status: None   Collection Time: 03/04/20 12:15 AM  Result Value Ref Range   Preg Test, Ur NEGATIVE NEGATIVE  Urinalysis, Routine w reflex microscopic     Status: Abnormal   Collection Time: 03/04/20 12:15 AM  Result Value Ref Range   Color, Urine YELLOW YELLOW   APPearance CLEAR CLEAR   Specific Gravity, Urine <1.005 (L) 1.005 - 1.030   pH 7.5 5.0 - 8.0   Glucose, UA NEGATIVE NEGATIVE mg/dL   Hgb urine dipstick NEGATIVE NEGATIVE   Bilirubin Urine NEGATIVE NEGATIVE   Ketones, ur NEGATIVE NEGATIVE mg/dL   Protein, ur NEGATIVE NEGATIVE mg/dL   Nitrite NEGATIVE NEGATIVE   Leukocytes,Ua TRACE (A) NEGATIVE  Urinalysis, Microscopic (reflex)     Status: Abnormal   Collection Time: 03/04/20 12:15 AM  Result Value Ref Range   RBC / HPF NONE SEEN 0 - 5 RBC/hpf   WBC, UA 0-5 0 - 5 WBC/hpf   Bacteria, UA RARE (A) NONE SEEN   Squamous Epithelial / LPF 0-5 0 - 5   Imaging Studies: No results found.  ED COURSE and MDM  Nursing notes, initial and subsequent vitals signs, including pulse oximetry, reviewed and interpreted by myself.  Vitals:   03/03/20 2358 03/04/20 0008 03/04/20 0130 03/04/20 0215  BP: 128/72 112/72 102/66 98/68  Pulse:  76 64 60 60  Resp: 16  14 17   Temp: 98.2 F (36.8 C)     TempSrc: Oral     SpO2: 100% 100% 99% 98%   Medications  pantoprazole (PROTONIX) injection 40 mg (40 mg Intravenous Given 03/04/20 0014)  ondansetron [facility]) injection 4 mg (4 mg Intravenous Given 03/04/20 0014)  sucralfate (CARAFATE) 1 GM/10ML suspension 1 g (1 g Oral Given 03/04/20 0053)   2:34 AM Patient's pain relieved with oral Carafate and IV Protonix.  Abdomen is soft, nontender.  I suspect this represents gastritis or early peptic ulcer disease.  Will start on a PPI and Carafate and refer to gastroenterology if symptoms persist.   PROCEDURES  Procedures   ED DIAGNOSES     ICD-10-CM   1. Epigastric pain  R10.13        Sriman Tally, Jenny Reichmann, MD 03/04/20 [phone]

## 2020-04-04 ENCOUNTER — Encounter: Payer: Self-pay | Admitting: Nurse Practitioner

## 2020-04-16 NOTE — Progress Notes (Signed)
04/16/2020 Stacey Blackwell [id number] 18-Sep-1976   CHIEF COMPLAINT: Heartburn and upper abdominal pain  HISTORY OF PRESENT ILLNESS:  Stacey Blackwell is a 44 year old female with a history of vitamin B 12 deficiency. Past cholecystectomy 2005).  She presented to the [facility] ED 03/04/2020 with epigastric pain 1 week.  Labs in the ED showed a WBC 8.4. Hg 13.9. HCT 41.1.  AST 36.  ALT 2 1.  She received IV Protonix and Carafate. She took Protonix QD and Carafate bid for 1 week then stopped both these medications as her symptoms did not improve.  She presents today for further GI evaluation. She reports having daily heartburn.  She describes a hot burning feeling to the epigastric area which is worse after she eats or drinks fluids.  No dysphagia.  She has occasional nausea if she eats too much.  No vomiting.  Infrequent NSAID use. She continues to have epigastric pain which is worse after eating with associated abdominal bloat.  No specific food triggers.  She sometimes feels short of breath due to her abdominal bloat discomfort.  She is passing a normal brown formed stool once daily.  She does not feel constipated.  No rectal bleeding or melena.  She coomplains of weight gain.  She reported weighing 178 pounds 2 months ago, today weighs 201 pounds.  No fever, sweats or chills.  She is taking Mylanta 1 tablespoon as needed with some relief.  She takes aspirin 325 mg once weekly for headaches.  No other NSAID use.  No alcohol use.  No family history of esophageal stomach or colon cancer.  No other complaints today.     CBC Latest Ref Rng & Units 03/04/2020 04/24/2018 03/08/2016  WBC 4.0 - 10.5 K/uL 8.4 8.3 7.0  Hemoglobin 12.0 - 15.0 g/dL 37.6 11.2(L) 10.7(L)  Hematocrit 36.0 - 46.0 % 41.1 35.4(L) 34.8(L)  Platelets 150 - 400 K/uL 382 365 395      CMP Latest Ref Rng & Units 03/04/2020 04/24/2018 03/08/2016  Glucose 70 - 99 mg/dL 94 80 283(T)  BUN 6 - 20 mg/dL 13 9 8   Creatinine 0.44  - 1.00 mg/dL 5.17 6.16  Sodium 135 - 145 mmol/L 138 138 138  Potassium 3.5 - 5.1 mmol/L 3.6 3.7 4.3  Chloride 98 - 111 mmol/L 103 103 102  CO2 22 - 32 mmol/L 24 22 25   Calcium 8.9 - 10.3 mg/dL 9.5 9.0 9.7  Total Protein 6.5 - 8.1 g/dL 8.1 7.6 -  Total Bilirubin 0.3 - 1.2 mg/dL 0.6 0.7 -  Alkaline Phos 38 - 126 U/L 73 74 -  AST 15 - 41 U/L 36 32 -  ALT 0 - 44 U/L 51(H) 23 -    No past medical history on file. Past Surgical History:  Procedure Laterality Date  . CHOLECYSTECTOMY      Social History: Divorced. 2 sons 13 and 7 and daughter age 40. Nonsmoker. No alcohol use. No drug use.  Family History: Father died 52 MI.  Mother 54 healthy. No family history of esophageal, gastric or colon cancer. 2 brothers and 2 sisters health.     Outpatient Encounter Medications as of 04/18/2020  Medication Sig  . esomeprazole (NEXIUM) 40 MG capsule Take 1 capsule daily at least 30 minutes before first dose of Carafate.  . sucralfate (CARAFATE) 1 g tablet Take 1 tablet (1 g total) by mouth 4 (four) times daily -  with meals and at bedtime.  . [DISCONTINUED]  cetirizine (ZYRTEC ALLERGY) 10 MG tablet Take 1 tablet (10 mg total) by mouth daily. (Patient not taking: Reported on 03/04/2017)  . [DISCONTINUED] fluticasone (FLONASE) 50 MCG/ACT nasal spray Place 2 sprays into both nostrils daily. (Patient not taking: Reported on 03/04/2017)   No facility-administered encounter medications on file as of 04/18/2020.     REVIEW OF SYSTEMS:  Gen: Denies fever, sweats or chills. No weight loss.  CV: Denies chest pain, palpitations or edema. Resp: Denies cough, shortness of breath of hemoptysis.  GI: See HPI. GU : Denies urinary burning, blood in urine, increased urinary frequency or incontinence. MS: Denies joint pain, muscles aches or weakness. Derm: Denies rash, itchiness, skin lesions or unhealing ulcers. Psych: Denies depression, anxiety, memory loss, suicidal ideation and confusion. Heme: Denies  bruising, bleeding. Neuro:  Denies headaches, dizziness or paresthesias. Endo:  Denies any problems with DM, thyroid or adrenal function.   PHYSICAL EXAM: Temp 98.9 F (37.2 C)   Ht 5' 2.25" (1.581 m) Comment: height measured without shoes  Wt 201 lb 2 oz (91.2 kg)   BMI 36.49 kg/m    LMP 2016 "early menopause".  General: Well developed  44 year old female in no acute distress. Head: Normocephalic and atraumatic. Eyes:  Sclerae non-icteric, conjunctive pink. Ears: Normal auditory acuity. Mouth: Dentition intact. No ulcers or lesions.  Neck: Supple, no lymphadenopathy or thyromegaly.  Lungs: Clear bilaterally to auscultation without wheezes, crackles or rhonchi. Heart: Regular rate and rhythm. No murmur, rub or gallop appreciated.  Abdomen: Soft, non distended. Epigastric, LUQ and LLQ tenderness without rebound or guarding. No masses. No hepatosplenomegaly. Normoactive bowel sounds x 4 quadrants.  Rectal: Deferred.  Musculoskeletal: Symmetrical with no gross deformities. Skin: Warm and dry. No rash or lesions on visible extremities. Extremities: No edema. Neurological: Alert oriented x 4, no focal deficits.  Psychological:  Alert and cooperative. Normal mood and affect.  ASSESSMENT AND PLAN:  94. 44 year old female with epigastric pain  -EGD benefits and risks discussed including risk with sedation, risk of bleeding, perforation and infection  -Omeprazole 40mg  daily -GERD diet discussed  -Call our office if her symptoms worsen  2. Abdominal pain, epigastric, LUQ and LLQ tenderness on exam. -CTAP with oral and IV contrast  -CBC, CMP, IgA, tTG, lipase and CRP  3. Abdominal bloat  -See plan in  # 1 and in # 2  4. Obesity. Weight gain -TSH  5.  Mildly elevated ALT -CMP   Further recommendations to be determined after the above evaluation completed      CC:  No ref. provider found

## 2020-04-18 ENCOUNTER — Ambulatory Visit (INDEPENDENT_AMBULATORY_CARE_PROVIDER_SITE_OTHER): Payer: Managed Care, Other (non HMO) | Admitting: Nurse Practitioner

## 2020-04-18 ENCOUNTER — Encounter: Payer: Self-pay | Admitting: Nurse Practitioner

## 2020-04-18 VITALS — BP 104/70 | HR 84 | Temp 98.9°F | Ht 62.25 in | Wt 201.1 lb

## 2020-04-18 DIAGNOSIS — R1013 Epigastric pain: Secondary | ICD-10-CM

## 2020-04-18 DIAGNOSIS — R140 Abdominal distension (gaseous): Secondary | ICD-10-CM

## 2020-04-18 DIAGNOSIS — R635 Abnormal weight gain: Secondary | ICD-10-CM

## 2020-04-18 DIAGNOSIS — K219 Gastro-esophageal reflux disease without esophagitis: Secondary | ICD-10-CM | POA: Diagnosis not present

## 2020-04-18 DIAGNOSIS — R1032 Left lower quadrant pain: Secondary | ICD-10-CM

## 2020-04-18 MED ORDER — OMEPRAZOLE 40 MG PO CPDR
40.0000 mg | DELAYED_RELEASE_CAPSULE | Freq: Every day | ORAL | 1 refills | Status: DC
Start: 2020-04-18 — End: 2021-02-21

## 2020-04-18 NOTE — Progress Notes (Signed)
Reviewed and agree with management plan.  Maiko Salais T. Ram Haugan, MD FACG [facility]  

## 2020-04-18 NOTE — Patient Instructions (Signed)
If you are age 43 or older, your body mass index should be between 23-30. Your Body mass index is 36.49 kg/m. If this is out of the aforementioned range listed, please consider follow up with your Primary Care Provider.  If you are age 2 or younger, your body mass index should be between 19-25. Your Body mass index is 36.49 kg/m. If this is out of the aformentioned range listed, please consider follow up with your Primary Care Provider.   Your provider has requested that you go to the basement level for lab work before leaving today. Press "B" on the elevator. The lab is located at the first door on the left as you exit the elevator.  We have sent the following medications to your pharmacy for you to pick up at your convenience:  Omeprazole '40mg'$  1 daily  You have been scheduled for a CT scan of the abdomen and pelvis at [facility]. Please stop and pick up your contrast and instructions today.  You are scheduled on 04/21/2020 at 2:00pm. You should arrive 15 minutes prior to your appointment time for registration. Please follow the written instructions below on the day of your exam:  WARNING: IF YOU ARE ALLERGIC TO IODINE/X-RAY DYE, PLEASE NOTIFY RADIOLOGY IMMEDIATELY AT [phone]! YOU WILL BE GIVEN A 13 HOUR PREMEDICATION PREP.  1) Do not eat or drink anything after 10:00 (4 hours prior to your test) 2) You have been given 2 bottles of oral contrast to drink. The solution may taste better if refrigerated, but do NOT add ice or any other liquid to this solution. Shake well before drinking.    Drink 1 bottle of contrast @ 12:00 pm (2 hours prior to your exam)  Drink 1 bottle of contrast @ 1:00 pm (1 hour prior to your exam)  You may take any medications as prescribed with a small amount of water, if necessary. If you take any of the following medications: METFORMIN, GLUCOPHAGE, GLUCOVANCE, AVANDAMET, RIOMET, FORTAMET, [facility] MET, JANUMET, GLUMETZA or METAGLIP, you MAY be asked to  HOLD this medication 48 hours AFTER the exam.  The purpose of you drinking the oral contrast is to aid in the visualization of your intestinal tract. The contrast solution may cause some diarrhea. Depending on your individual set of symptoms, you may also receive an intravenous injection of x-ray contrast/dye. Plan on being at [facility] for 30 minutes or longer, depending on the type of exam you are having performed.  This test typically takes 30-45 minutes to complete.  If you have any questions regarding your exam or if you need to reschedule, you may call the CT department at [phone] between the hours of 8:00 am and 5:00 pm, Monday-Friday.  ________________________________________________________________________   Gastroesophageal Reflux Disease, Adult Gastroesophageal reflux (GER) happens when acid from the stomach flows up into the tube that connects the mouth and the stomach (esophagus). Normally, food travels down the esophagus and stays in the stomach to be digested. With GER, food and stomach acid sometimes move back up into the esophagus. You may have a disease called gastroesophageal reflux disease (GERD) if the reflux:  Happens often.  Causes frequent or very bad symptoms.  Causes problems such as damage to the esophagus. When this happens, the esophagus becomes sore and swollen (inflamed). Over time, GERD can make small holes (ulcers) in the lining of the esophagus. What are the causes? This condition is caused by a problem with the muscle between the esophagus and the stomach.  When this muscle is weak or not normal, it does not close properly to keep food and acid from coming back up from the stomach. The muscle can be weak because of:  Tobacco use.  Pregnancy.  Having a certain type of hernia (hiatal hernia).  Alcohol use.  Certain foods and drinks, such as coffee, chocolate, onions, and peppermint. What increases the risk? You are more likely to develop  this condition if you:  Are overweight.  Have a disease that affects your connective tissue.  Use NSAID medicines. What are the signs or symptoms? Symptoms of this condition include:  Heartburn.  Difficult or painful swallowing.  The feeling of having a lump in the throat.  A bitter taste in the mouth.  Bad breath.  Having a lot of saliva.  Having an upset or bloated stomach.  Belching.  Chest pain. Different conditions can cause chest pain. Make sure you see your doctor if you have chest pain.  Shortness of breath or noisy breathing (wheezing).  Ongoing (chronic) cough or a cough at night.  Wearing away of the surface of teeth (tooth enamel).  Weight loss. How is this treated? Treatment will depend on how bad your symptoms are. Your doctor may suggest:  Changes to your diet.  Medicine.  Surgery. Follow these instructions at home: Eating and drinking   Follow a diet as told by your doctor. You may need to avoid foods and drinks such as: ? Coffee and tea (with or without caffeine). ? Drinks that contain alcohol. ? Energy drinks and sports drinks. ? Bubbly (carbonated) drinks or sodas. ? Chocolate and cocoa. ? Peppermint and mint flavorings. ? Garlic and onions. ? Horseradish. ? Spicy and acidic foods. These include peppers, chili powder, curry powder, vinegar, hot sauces, and BBQ sauce. ? Citrus fruit juices and citrus fruits, such as oranges, lemons, and limes. ? Tomato-based foods. These include red sauce, chili, salsa, and pizza with red sauce. ? Fried and fatty foods. These include donuts, french fries, potato chips, and high-fat dressings. ? High-fat meats. These include hot dogs, rib eye steak, sausage, ham, and bacon. ? High-fat dairy items, such as whole milk, butter, and cream cheese.  Eat small meals often. Avoid eating large meals.  Avoid drinking large amounts of liquid with your meals.  Avoid eating meals during the 2-3 hours before  bedtime.  Avoid lying down right after you eat.  Do not exercise right after you eat. Lifestyle   Do not use any products that contain nicotine or tobacco. These include cigarettes, e-cigarettes, and chewing tobacco. If you need help quitting, ask your doctor.  Try to lower your stress. If you need help doing this, ask your doctor.  If you are overweight, lose an amount of weight that is healthy for you. Ask your doctor about a safe weight loss goal. General instructions  Pay attention to any changes in your symptoms.  Take over-the-counter and prescription medicines only as told by your doctor. Do not take aspirin, ibuprofen, or other NSAIDs unless your doctor says it is okay.  Wear loose clothes. Do not wear anything tight around your waist.  Raise (elevate) the head of your bed about 6 inches (15 cm).  Avoid bending over if this makes your symptoms worse.  Keep all follow-up visits as told by your doctor. This is important. Contact a doctor if:  You have new symptoms.  You lose weight and you do not know why.  You have trouble swallowing or it  hurts to swallow.  You have wheezing or a cough that keeps happening.  Your symptoms do not get better with treatment.  You have a hoarse voice. Get help right away if:  You have pain in your arms, neck, jaw, teeth, or back.  You feel sweaty, dizzy, or light-headed.  You have chest pain or shortness of breath.  You throw up (vomit) and your throw-up looks like blood or coffee grounds.  You pass out (faint).  Your poop (stool) is bloody or black.  You cannot swallow, drink, or eat. Summary  If a person has gastroesophageal reflux disease (GERD), food and stomach acid move back up into the esophagus and cause symptoms or problems such as damage to the esophagus.  Treatment will depend on how bad your symptoms are.  Follow a diet as told by your doctor.  Take all medicines only as told by your doctor. This  information is not intended to replace advice given to you by your health care provider. Make sure you discuss any questions you have with your health care provider. Document Revised: 06/18/2018 Document Reviewed: 06/18/2018 Elsevier Patient Education  2020 [place] for Gastroesophageal Reflux Disease, Adult When you have gastroesophageal reflux disease (GERD), the foods you eat and your eating habits are very important. Choosing the right foods can help ease your discomfort. Think about working with a nutrition specialist (dietitian) to help you make good choices. What are tips for following this plan?  Meals  Choose healthy foods that are low in fat, such as fruits, vegetables, whole grains, low-fat dairy products, and lean meat, fish, and poultry.  Eat small meals often instead of 3 large meals a day. Eat your meals slowly, and in a place where you are relaxed. Avoid bending over or lying down until 2-3 hours after eating.  Avoid eating meals 2-3 hours before bed.  Avoid drinking a lot of liquid with meals.  Cook foods using methods other than frying. Bake, grill, or broil food instead.  Avoid or limit: ? Chocolate. ? Peppermint or spearmint. ? Alcohol. ? Pepper. ? Black and decaffeinated coffee. ? Black and decaffeinated tea. ? Bubbly (carbonated) soft drinks. ? Caffeinated energy drinks and soft drinks.  Limit high-fat foods such as: ? Fatty meat or fried foods. ? Whole milk, cream, butter, or ice cream. ? Nuts and nut butters. ? Pastries, donuts, and sweets made with butter or shortening.  Avoid foods that cause symptoms. These foods may be different for everyone. Common foods that cause symptoms include: ? Tomatoes. ? Oranges, lemons, and limes. ? Peppers. ? Spicy food. ? Onions and garlic. ? Vinegar. Lifestyle  Maintain a healthy weight. Ask your doctor what weight is healthy for you. If you need to lose weight, work with your doctor to do so  safely.  Exercise for at least 30 minutes for 5 or more days each week, or as told by your doctor.  Wear loose-fitting clothes.  Do not smoke. If you need help quitting, ask your doctor.  Sleep with the head of your bed higher than your feet. Use a wedge under the mattress or blocks under the bed frame to raise the head of the bed. Summary  When you have gastroesophageal reflux disease (GERD), food and lifestyle choices are very important in easing your symptoms.  Eat small meals often instead of 3 large meals a day. Eat your meals slowly, and in a place where you are relaxed.  Limit high-fat foods such  as fatty meat or fried foods.  Avoid bending over or lying down until 2-3 hours after eating.  Avoid peppermint and spearmint, caffeine, alcohol, and chocolate. This information is not intended to replace advice given to you by your health care provider. Make sure you discuss any questions you have with your health care provider. Document Revised: 04/02/2019 Document Reviewed: 01/15/2017 Elsevier Patient Education  [place].

## 2020-04-21 ENCOUNTER — Telehealth: Payer: Self-pay | Admitting: Nurse Practitioner

## 2020-04-21 ENCOUNTER — Ambulatory Visit (HOSPITAL_COMMUNITY): Admission: RE | Admit: 2020-04-21 | Payer: Managed Care, Other (non HMO) | Source: Ambulatory Visit

## 2020-04-21 NOTE — Telephone Encounter (Signed)
Faxed per patient request.  The patient has already contacted radiology.

## 2020-04-21 NOTE — Telephone Encounter (Signed)
Pt requested to have orders for CT abd pelvis w contrast faxed to [facility]: [phone].    Pt is currently scheduled for CT at [facility] 04/25/20.

## 2020-04-25 ENCOUNTER — Ambulatory Visit (HOSPITAL_COMMUNITY): Payer: Managed Care, Other (non HMO)

## 2020-04-27 ENCOUNTER — Encounter: Payer: Managed Care, Other (non HMO) | Admitting: Gastroenterology

## 2020-04-27 ENCOUNTER — Other Ambulatory Visit: Payer: Self-pay

## 2020-04-27 ENCOUNTER — Encounter: Payer: Self-pay | Admitting: Gastroenterology

## 2020-04-27 ENCOUNTER — Ambulatory Visit (AMBULATORY_SURGERY_CENTER): Payer: Managed Care, Other (non HMO) | Admitting: Gastroenterology

## 2020-04-27 VITALS — BP 105/52 | HR 87 | Temp 97.5°F | Resp 14 | Ht 62.0 in | Wt 201.0 lb

## 2020-04-27 DIAGNOSIS — K219 Gastro-esophageal reflux disease without esophagitis: Secondary | ICD-10-CM

## 2020-04-27 DIAGNOSIS — K2950 Unspecified chronic gastritis without bleeding: Secondary | ICD-10-CM | POA: Diagnosis not present

## 2020-04-27 DIAGNOSIS — R1013 Epigastric pain: Secondary | ICD-10-CM

## 2020-04-27 DIAGNOSIS — K3189 Other diseases of stomach and duodenum: Secondary | ICD-10-CM | POA: Diagnosis not present

## 2020-04-27 DIAGNOSIS — K319 Disease of stomach and duodenum, unspecified: Secondary | ICD-10-CM

## 2020-04-27 HISTORY — PX: UPPER GASTROINTESTINAL ENDOSCOPY: SHX188

## 2020-04-27 MED ORDER — SODIUM CHLORIDE 0.9 % IV SOLN: 500.0000 mL | Freq: Once | INTRAVENOUS | Status: DC

## 2020-04-27 NOTE — Progress Notes (Signed)
Temp check by:LS Vital check by:DT  The medical and surgical history was reviewed and verified with the patient. 

## 2020-04-27 NOTE — Patient Instructions (Signed)
YOU HAD AN ENDOSCOPIC PROCEDURE TODAY AT THE [facility]:   Refer to the procedure report that was given to you for any specific questions about what was found during the examination.  If the procedure report does not answer your questions, please call your gastroenterologist to clarify.  If you requested that your care partner not be given the details of your procedure findings, then the procedure report has been included in a sealed envelope for you to review at your convenience later.  YOU SHOULD EXPECT: Some feelings of bloating in the abdomen. Passage of more gas than usual.  Walking can help get rid of the air that was put into your GI tract during the procedure and reduce the bloating. If you had a lower endoscopy (such as a colonoscopy or flexible sigmoidoscopy) you may notice spotting of blood in your stool or on the toilet paper. If you underwent a bowel prep for your procedure, you may not have a normal bowel movement for a few days.  Please Note:  You might notice some irritation and congestion in your nose or some drainage.  This is from the oxygen used during your procedure.  There is no need for concern and it should clear up in a day or so.  SYMPTOMS TO REPORT IMMEDIATELY:    Following upper endoscopy (EGD)  Vomiting of blood or coffee ground material  New chest pain or pain under the shoulder blades  Painful or persistently difficult swallowing  New shortness of breath  Fever of 100F or higher  Black, tarry-looking stools  For urgent or emergent issues, a gastroenterologist can be reached at any hour by calling [phone]. Do not use MyChart messaging for urgent concerns.    DIET:  We do recommend a small meal at first, but then you may proceed to your regular diet.  Drink plenty of fluids but you should avoid alcoholic beverages for 24 hours.  ACTIVITY:  You should plan to take it easy for the rest of today and you should NOT DRIVE or use heavy machinery  until tomorrow (because of the sedation medicines used during the test).    FOLLOW UP: Our staff will call the number listed on your records 48-72 hours following your procedure to check on you and address any questions or concerns that you may have regarding the information given to you following your procedure. If we do not reach you, we will leave a message.  We will attempt to reach you two times.  During this call, we will ask if you have developed any symptoms of COVID 19. If you develop any symptoms (ie: fever, flu-like symptoms, shortness of breath, cough etc.) before then, please call [phone].  If you test positive for Covid 19 in the 2 weeks post procedure, please call and report this information to us.    If any biopsies were taken you will be contacted by phone or by letter within the next 1-3 weeks.  Please call us at [phone] if you have not heard about the biopsies in 3 weeks.    SIGNATURES/CONFIDENTIALITY: You and/or your care partner have signed paperwork which will be entered into your electronic medical record.  These signatures attest to the fact that that the information above on your After Visit Summary has been reviewed and is understood.  Full responsibility of the confidentiality of this discharge information lies with you and/or your care-partner. 

## 2020-04-27 NOTE — Op Note (Signed)
[facility] Patient Name: Stacey Blackwell Procedure Date: 04/27/2020 3:39 PM MRN: [id number] Endoscopist: Meryl Dare , MD Age: 44 Referring MD:  Date of Birth: [birth year] Gender: Female Account #: [id number] Procedure:                Upper GI endoscopy Indications:              Epigastric abdominal pain, Suspected                            gastroesophageal reflux disease Medicines:                Monitored Anesthesia Care Procedure:                Pre-Anesthesia Assessment:                           - Prior to the procedure, a History and Physical                            was performed, and patient medications and                            allergies were reviewed. The patient's tolerance of                            previous anesthesia was also reviewed. The risks                            and benefits of the procedure and the sedation                            options and risks were discussed with the patient.                            All questions were answered, and informed consent                            was obtained. Prior Anticoagulants: The patient has                            taken no previous anticoagulant or antiplatelet                            agents. ASA Grade Assessment: II - A patient with                            mild systemic disease. After reviewing the risks                            and benefits, the patient was deemed in                            satisfactory condition to undergo the procedure.  After obtaining informed consent, the endoscope was                            passed under direct vision. Throughout the                            procedure, the patient's blood pressure, pulse, and                            oxygen saturations were monitored continuously. The                            Endoscope was introduced through the mouth, and                            advanced to the second part of  duodenum. The upper                            GI endoscopy was accomplished without difficulty.                            The patient tolerated the procedure well. Scope In: Scope Out: Findings:                 The examined esophagus was normal.                           Patchy mildly erythematous mucosa without bleeding                            was found in the gastric body and in the gastric                            antrum. Biopsies were taken with a cold forceps for                            histology.                           The exam of the stomach was otherwise normal.                           The duodenal bulb and second portion of the                            duodenum were normal. Complications:            No immediate complications. Estimated Blood Loss:     Estimated blood loss was minimal. Impression:               - Normal esophagus.                           - Erythematous mucosa in the gastric body and  antrum. Biopsied.                           - Normal duodenal bulb and second portion of the                            duodenum. Recommendation:           - Patient has a contact number available for                            emergencies. The signs and symptoms of potential                            delayed complications were discussed with the                            patient. Return to normal activities tomorrow.                            Written discharge instructions were provided to the                            patient.                           - Resume previous diet.                           - Antireflux measures.                           - Continue present medications.                           - Await pathology results. Meryl Dare, MD 04/27/2020 4:00:20 PM This report has been signed electronically.

## 2020-04-27 NOTE — Progress Notes (Signed)
Report to PACU, RN, vss, BBS= Clear.  

## 2020-04-27 NOTE — Progress Notes (Signed)
Called to room to assist during endoscopic procedure.  Patient ID and intended procedure confirmed with present staff. Received instructions for my participation in the procedure from the performing physician.  

## 2020-04-28 ENCOUNTER — Ambulatory Visit (HOSPITAL_COMMUNITY): Payer: Managed Care, Other (non HMO)

## 2020-04-29 ENCOUNTER — Telehealth: Payer: Self-pay | Admitting: *Deleted

## 2020-04-29 ENCOUNTER — Telehealth: Payer: Self-pay

## 2020-04-29 NOTE — Telephone Encounter (Signed)
  Follow up Call-  Call back number 04/27/2020  Post procedure Call Back phone  # [phone]  Permission to leave phone message Yes  Some recent data might be hidden     Patient questions:  Do you have a fever, pain , or abdominal swelling? No. Pain Score  0 *  Have you tolerated food without any problems? Yes.    Have you been able to return to your normal activities? Yes.    Do you have any questions about your discharge instructions: Diet   No. Medications  No. Follow up visit  No.  Do you have questions or concerns about your Care? No.  Actions: * If pain score is 4 or above: No action needed, pain <4. 1. Have you developed a fever since your procedure? no  2.   Have you had an respiratory symptoms (SOB or cough) since your procedure? no  3.   Have you tested positive for COVID 19 since your procedure no  4.   Have you had any family members/close contacts diagnosed with the COVID 19 since your procedure?  no   If yes to any of these questions please route to Laverna Peace, RN and Charlett Lango, RN

## 2020-04-29 NOTE — Telephone Encounter (Signed)
Left message on f/u call 

## 2020-05-05 ENCOUNTER — Encounter: Payer: Self-pay | Admitting: Gastroenterology

## 2020-05-06 ENCOUNTER — Telehealth: Payer: Self-pay | Admitting: Gastroenterology

## 2020-05-15 ENCOUNTER — Encounter: Payer: Self-pay | Admitting: Gastroenterology

## 2020-05-15 NOTE — Telephone Encounter (Signed)
Letter Sent asking to call back with results.

## 2020-09-05 ENCOUNTER — Emergency Department (HOSPITAL_COMMUNITY): Payer: Managed Care, Other (non HMO)

## 2020-09-05 ENCOUNTER — Emergency Department (HOSPITAL_COMMUNITY)
Admission: EM | Admit: 2020-09-05 | Discharge: 2020-09-06 | Disposition: A | Discharge status: Home / Self Care | Payer: Managed Care, Other (non HMO) | Attending: Emergency Medicine | Admitting: Emergency Medicine

## 2020-09-05 ENCOUNTER — Other Ambulatory Visit: Payer: Self-pay

## 2020-09-05 ENCOUNTER — Encounter (HOSPITAL_COMMUNITY): Payer: Self-pay | Admitting: Emergency Medicine

## 2020-09-05 DIAGNOSIS — M25562 Pain in left knee: Secondary | ICD-10-CM

## 2020-09-05 MED ORDER — ACETAMINOPHEN 325 MG PO TABS
650.0000 mg | ORAL_TABLET | Freq: Once | ORAL | Status: AC
  Administered 2020-09-05: 650 mg via ORAL
  Filled 2020-09-05: qty 2

## 2020-09-05 NOTE — ED Triage Notes (Signed)
Pt c/o L lower extremity pain from calf to knee, pt denies injury., pt states she recently injured R side of body at work and uses L side for support, works long hours on her feet.

## 2020-09-06 MED ORDER — NAPROXEN 500 MG PO TABS: 500.0000 mg | ORAL_TABLET | Freq: Two times a day (BID) | ORAL | 0 refills | Status: DC

## 2020-09-06 MED ORDER — NAPROXEN 250 MG PO TABS
500.0000 mg | ORAL_TABLET | Freq: Once | ORAL | Status: AC
  Administered 2020-09-06: 500 mg via ORAL
  Filled 2020-09-06: qty 2

## 2020-09-06 NOTE — ED Provider Notes (Addendum)
[facility] EMERGENCY DEPARTMENT Provider Note   CSN: [id number] Arrival date & time: 09/05/20  2025     History Chief Complaint  Patient presents with  . Knee Pain    Stacey Blackwell is a 44 y.o. female with history of gallstones presents for evaluation of acute onset, progressively worsening left knee pain for a few days.  Pain is worse along the medial aspect of the left knee.  No specific trauma or falls but she states that she has been using her left side more after injuring her right hand at work.  She is on her feet for long hours throughout the day.  Pain worsens with attempts to bear weight.  Denies numbness or tingling of the extremities, fevers.  Has taken Tylenol, applying ice and elevating with a little relief.  The history is provided by the patient.       Past Medical History:  Diagnosis Date  . Gallstones     Patient Active Problem List   Diagnosis Date Noted  . Abdominal pain, epigastric 04/18/2020    Past Surgical History:  Procedure Laterality Date  . CHOLECYSTECTOMY    . UPPER GASTROINTESTINAL ENDOSCOPY  04/27/2020     OB History   No obstetric history on file.     Family History  Problem Relation Age of Onset  . Healthy Mother   . Colon cancer Neg Hx   . Esophageal cancer Neg Hx   . Rectal cancer Neg Hx   . Stomach cancer Neg Hx     Social History   Tobacco Use  . Smoking status: Never Smoker  . Smokeless tobacco: Never Used  Vaping Use  . Vaping Use: Never used  Substance Use Topics  . Alcohol use: Yes    Comment: occasional  . Drug use: No    Home Medications Prior to Admission medications   Medication Sig Start Date End Date Taking? Authorizing Provider  naproxen (NAPROSYN) 500 MG tablet Take 1 tablet (500 mg total) by mouth 2 (two) times daily with a meal. 09/06/20   Morgen Ritacco A, PA-C  omeprazole (PRILOSEC) 40 MG capsule Take 1 capsule (40 mg total) by mouth daily. 04/18/20   Arnaldo Natal, NP  cetirizine (ZYRTEC ALLERGY) 10 MG tablet Take 1 tablet (10 mg total) by mouth daily. Patient not taking: Reported on 03/04/2017 12/12/16 03/03/20  Everlene Farrier, PA-C  fluticasone [facility]) 50 MCG/ACT nasal spray Place 2 sprays into both nostrils daily. Patient not taking: Reported on 03/04/2017 12/12/16 03/03/20  Everlene Farrier, PA-C    Allergies    Patient has no known allergies.  Review of Systems   Review of Systems  Constitutional: Negative for chills and fever.  Cardiovascular: Negative for leg swelling.  Musculoskeletal: Positive for arthralgias and joint swelling.  Neurological: Negative for weakness and numbness.    Physical Exam Updated Vital Signs BP 132/75   Pulse 64   Temp 97.9 F (36.6 C) (Oral)   Resp 17   Ht 5\' 2"  (1.575 m)   Wt 91 kg   SpO2 100%   BMI 36.69 kg/m   Physical Exam Vitals and nursing note reviewed.  Constitutional:      General: She is not in acute distress.    Appearance: She is well-developed.  HENT:     Head: Normocephalic and atraumatic.  Eyes:     General:        Right eye: No discharge.        Left  eye: No discharge.     Conjunctiva/sclera: Conjunctivae normal.  Neck:     Vascular: No JVD.     Trachea: No tracheal deviation.  Cardiovascular:     Rate and Rhythm: Normal rate.     Pulses: Normal pulses.     Comments: 2+ DP/PT pulses bilaterally, Homans' sign absent bilaterally.  No lower extremity edema. Pulmonary:     Effort: Pulmonary effort is normal.  Abdominal:     General: There is no distension.  Musculoskeletal:        General: Tenderness present.     Cervical back: No rigidity.     Comments: Tenderness to palpation along the medial joint line and MCL of the left knee.  No erythema or induration.  Negative anterior/posterior drawer test, no ligamentous laxity or varus or valgus instability noted.  Flexion and extension at the knee is limited in the setting of pain.  5/5 strength of BLE major muscle groups.    Skin:    General: Skin is warm.     Findings: No erythema.  Neurological:     Mental Status: She is alert.     Sensory: No sensory deficit.     Comments: Sensation intact to light touch of bilateral lower extremities.  Ambulatory with antalgic gait but exhibits good balance.  Psychiatric:        Behavior: Behavior normal.     ED Results / Procedures / Treatments   Labs (all labs ordered are listed, but only abnormal results are displayed) Labs Reviewed - No data to display  EKG None  Radiology DG Knee Complete 4 Views Left  Result Date: 09/05/2020 CLINICAL DATA:  Chronic left knee pain. EXAM: LEFT KNEE - COMPLETE 4+ VIEW COMPARISON:  None. FINDINGS: No evidence of acute fracture or dislocation. No evidence of arthropathy or other focal bone abnormality. A small to moderate sized joint effusion is noted. IMPRESSION: 1. Small to moderate sized joint effusion. Electronically Signed   By: Aram Candela M.D.   On: 09/05/2020 22:17    Procedures Procedures (including critical care time)  Medications Ordered in ED Medications  naproxen (NAPROSYN) tablet 500 mg (has no administration in time range)  acetaminophen (TYLENOL) tablet 650 mg (650 mg Oral Given 09/05/20 2148)    ED Course  I have reviewed the triage vital signs and the nursing notes.  Pertinent labs & imaging results that were available during my care of the patient were reviewed by me and considered in my medical decision making (see chart for details).    MDM Rules/Calculators/A&P                          Patient presenting for evaluation of left knee pain for a few days.  She is afebrile, vital signs are stable.  She is nontoxic in appearance.  She is neurovascularly intact and compartments are soft.  No signs of secondary skin infection or DVT on examination.  Doubt septic arthritis or osteomyelitis.  X-ray shows small to moderate sized joint effusion which could certainly be contributing to her pain.  We  discussed that therapeutic arthrocentesis is not routinely performed in the emergency department and she is understanding of this.  Conservative therapy indicated and discussed with patient.  Will discharge with prescription for naproxen and will give knee sleeve and crutches.  Recommend close follow-up with orthopedics on an outpatient basis and she was given the information for the orthopedic urgent care if needed.  Discuss  strict ED return precautions.  Patient verbalized understanding of and agreement with plan and patient is stable for discharge at this time. Final Clinical Impression(s) / ED Diagnoses Final diagnoses:  Acute pain of left knee    Rx / DC Orders ED Discharge Orders         Ordered    naproxen (NAPROSYN) 500 MG tablet  2 times daily with meals        09/06/20 1216            Marianela Mandrell, [place] A, PA-C 09/06/20 1221    Melene Plan, DO 09/06/20 1522

## 2020-09-06 NOTE — ED Notes (Signed)
Patient verbalizes understanding of discharge instructions. Opportunity for questioning and answers were provided. Armband removed by staff, pt discharged from ED. Pt. ambulatory and discharged home.  

## 2020-09-06 NOTE — Discharge Instructions (Signed)
1. Medications: Take naproxen twice daily with meals and and [phone] mg of Tylenol every 6 hours as needed for pain. Do not exceed 4000 mg of Tylenol daily.  Take naproxen with food to avoid upset stomach issues.  2. Treatment: rest, elevate and use knee sleeve and crutches, drink plenty of fluids, gentle stretching.  Apply ice or heat, whichever feels best 20 minutes at a time a few times daily.  See attached exercises. 3. Follow Up: Please followup with orthopedics for discussion of your diagnoses and further evaluation after today's visit; the office of [facility] has an orthopedic urgent care that can also be helpful.  You can call [phone] for more information.  Please return to the ER for worsening symptoms or other concerns such as worsening swelling, redness of the skin, fevers, loss of pulses, or loss of feeling

## 2021-01-13 ENCOUNTER — Ambulatory Visit
Admission: EM | Admit: 2021-01-13 | Discharge: 2021-01-13 | Disposition: A | Discharge status: Home / Self Care | Payer: Managed Care, Other (non HMO) | Attending: Emergency Medicine | Admitting: Emergency Medicine

## 2021-01-13 ENCOUNTER — Other Ambulatory Visit: Payer: Self-pay

## 2021-01-13 DIAGNOSIS — B349 Viral infection, unspecified: Secondary | ICD-10-CM

## 2021-01-13 DIAGNOSIS — R52 Pain, unspecified: Secondary | ICD-10-CM | POA: Diagnosis not present

## 2021-01-13 MED ORDER — TIZANIDINE HCL 4 MG PO TABS: 2.0000 mg | ORAL_TABLET | Freq: Four times a day (QID) | ORAL | 0 refills | Status: DC | PRN

## 2021-01-13 MED ORDER — BENZONATATE 200 MG PO CAPS: 200.0000 mg | ORAL_CAPSULE | Freq: Three times a day (TID) | ORAL | 0 refills | Status: AC | PRN

## 2021-01-13 MED ORDER — NAPROXEN 500 MG PO TABS: 500.0000 mg | ORAL_TABLET | Freq: Two times a day (BID) | ORAL | 0 refills | Status: DC

## 2021-01-13 NOTE — Discharge Instructions (Addendum)
COVID/flu test pending; check MyChart for results Naprosyn twice daily for headache, body aches May try tizanidine to further help with any neck/back pain Tessalon for cough Rest and fluids Follow-up if not improving or worsening

## 2021-01-13 NOTE — ED Triage Notes (Signed)
Patient states she has had a cough, fever, congestion, and body aches x 3 days. Pt is aox4 andd ambulatory.

## 2021-01-13 NOTE — ED Provider Notes (Signed)
[facility]    CSN: [id number] Arrival date & time: 01/13/21  1759      History   Chief Complaint Chief Complaint  Patient presents with  . Cough    X 3 days  . Headache    X 3 days  . Generalized Body Aches    X 3 days    HPI Stacey Blackwell is a 45 y.o. female presenting today for evaluation of headache fatigue and body aches.  Patient reports over the past 3 days she has had progressively worsening headache fatigue and body aches.  Reports fever up to 100.5 yesterday.  Has had very mild URI symptoms of congestion and cough.  Main complaint is body aches.  At work today had very low energy and fatigue.  Denies any known sick contacts or known exposure to COVID.  Reports COVID test last week but this was prior to onset of symptoms.  HPI  History reviewed. No pertinent past medical history.  There are no problems to display for this patient.   History reviewed. No pertinent surgical history.  OB History   No obstetric history on file.      Home Medications    Prior to Admission medications   Medication Sig Start Date End Date Taking? Authorizing Provider  benzonatate (TESSALON) 200 MG capsule Take 1 capsule (200 mg total) by mouth 3 (three) times daily as needed for up to 7 days for cough. 01/13/21 01/20/21 Yes Jadah Bobak C, PA-C  naproxen (NAPROSYN) 500 MG tablet Take 1 tablet (500 mg total) by mouth 2 (two) times daily. 01/13/21  Yes Rhylee Nunn C, PA-C  tiZANidine (ZANAFLEX) 4 MG tablet Take 0.5-1 tablets (2-4 mg total) by mouth every 6 (six) hours as needed for muscle spasms. 01/13/21  Yes Jasmyn Picha, Junius Creamer, PA-C    Family History History reviewed. No pertinent family history.  Social History Social History   Tobacco Use  . Smoking status: Never Smoker  . Smokeless tobacco: Never Used  Vaping Use  . Vaping Use: Never used  Substance Use Topics  . Alcohol use: Yes  . Drug use: Never     Allergies   Patient has no known  allergies.   Review of Systems Review of Systems  Constitutional: Positive for chills, fatigue and fever. Negative for activity change and appetite change.  HENT: Positive for congestion. Negative for ear pain, rhinorrhea, sinus pressure, sore throat and trouble swallowing.   Eyes: Negative for discharge and redness.  Respiratory: Positive for cough. Negative for chest tightness and shortness of breath.   Cardiovascular: Negative for chest pain.  Gastrointestinal: Negative for abdominal pain, diarrhea, nausea and vomiting.  Musculoskeletal: Positive for myalgias.  Skin: Negative for rash.  Neurological: Positive for headaches. Negative for dizziness and light-headedness.     Physical Exam Triage Vital Signs ED Triage Vitals  Enc Vitals Group     BP 01/13/21 1844 112/79     Pulse Rate 01/13/21 1844 82     Resp 01/13/21 1844 19     Temp 01/13/21 1844 98.2 F (36.8 C)     Temp Source 01/13/21 1844 Oral     SpO2 01/13/21 1844 96 %     Weight --      Height --      Head Circumference --      Peak Flow --      Pain Score 01/13/21 1846 10     Pain Loc --      Pain Edu? --  Excl. in GC? --    No data found.  Updated Vital Signs BP 112/79 (BP Location: Left Arm)   Pulse 82   Temp 98.2 F (36.8 C) (Oral)   Resp 19   SpO2 96%   Visual Acuity Right Eye Distance:   Left Eye Distance:   Bilateral Distance:    Right Eye Near:   Left Eye Near:    Bilateral Near:     Physical Exam Vitals and nursing note reviewed.  Constitutional:      Appearance: She is well-developed and well-nourished.     Comments: No acute distress  HENT:     Head: Normocephalic and atraumatic.     Ears:     Comments: Bilateral ears without tenderness to palpation of external auricle, tragus and mastoid, EAC's without erythema or swelling, TM's with good bony landmarks and cone of light. Non erythematous.     Nose: Nose normal.     Mouth/Throat:     Comments: Oral mucosa pink and moist, no  tonsillar enlargement or exudate. Posterior pharynx patent and nonerythematous, no uvula deviation or swelling. Normal phonation. Eyes:     Conjunctiva/sclera: Conjunctivae normal.  Cardiovascular:     Rate and Rhythm: Normal rate and regular rhythm.  Pulmonary:     Effort: Pulmonary effort is normal. No respiratory distress.     Comments: Breathing comfortably at rest, CTABL, no wheezing, rales or other adventitious sounds auscultated Abdominal:     General: There is no distension.  Musculoskeletal:        General: Normal range of motion.     Cervical back: Neck supple.  Skin:    General: Skin is warm and dry.  Neurological:     Mental Status: She is alert and oriented to person, place, and time.  Psychiatric:        Mood and Affect: Mood and affect normal.      UC Treatments / Results  Labs (all labs ordered are listed, but only abnormal results are displayed) Labs Reviewed  COVID-19, FLU A+B NAA    EKG   Radiology No results found.  Procedures Procedures (including critical care time)  Medications Ordered in UC Medications - No data to display  Initial Impression / Assessment and Plan / UC Course  I have reviewed the triage vital signs and the nursing notes.  Pertinent labs & imaging results that were available during my care of the patient were reviewed by me and considered in my medical decision making (see chart for details).    Influenza-like illness-COVID/flu test pending, exam reassuring, suspect likely viral etiology and recommending continued symptomatic and supportive care.  Rest and fluids.  Provided Naprosyn as well as tizanidine to help with body aches/back pain.  Lungs clear to auscultation.  Minimal URI symptoms at this time.  Continue to monitor.  Discussed strict return precautions. Patient verbalized understanding and is agreeable with plan.  Final Clinical Impressions(s) / UC Diagnoses   Final diagnoses:  Viral illness  Generalized body  aches     Discharge Instructions     COVID/flu test pending; check MyChart for results Naprosyn twice daily for headache, body aches May try tizanidine to further help with any neck/back pain Tessalon for cough Rest and fluids Follow-up if not improving or worsening     ED Prescriptions    Medication Sig Dispense Auth. Provider   naproxen (NAPROSYN) 500 MG tablet Take 1 tablet (500 mg total) by mouth 2 (two) times daily. 30 tablet Micco Bourbeau, [place]  C, PA-C   tiZANidine (ZANAFLEX) 4 MG tablet Take 0.5-1 tablets (2-4 mg total) by mouth every 6 (six) hours as needed for muscle spasms. 30 tablet Jahfari Ambers C, PA-C   benzonatate (TESSALON) 200 MG capsule Take 1 capsule (200 mg total) by mouth 3 (three) times daily as needed for up to 7 days for cough. 28 capsule Brylea Pita, [place] C, PA-C     PDMP not reviewed this encounter.   Lew Dawes, [place] 01/13/21 1934

## 2021-01-17 ENCOUNTER — Encounter (HOSPITAL_COMMUNITY): Payer: Self-pay | Admitting: Emergency Medicine

## 2021-01-17 LAB — COVID-19, FLU A+B NAA
Influenza A, NAA: NOT DETECTED
Influenza B, NAA: NOT DETECTED
SARS-CoV-2, NAA: DETECTED — AB

## 2021-02-21 ENCOUNTER — Other Ambulatory Visit: Payer: Self-pay

## 2021-02-21 ENCOUNTER — Ambulatory Visit
Admission: EM | Admit: 2021-02-21 | Discharge: 2021-02-21 | Disposition: A | Discharge status: Home / Self Care | Payer: Managed Care, Other (non HMO) | Attending: Urgent Care | Admitting: Urgent Care

## 2021-02-21 ENCOUNTER — Ambulatory Visit (INDEPENDENT_AMBULATORY_CARE_PROVIDER_SITE_OTHER): Payer: Managed Care, Other (non HMO)

## 2021-02-21 DIAGNOSIS — M79672 Pain in left foot: Secondary | ICD-10-CM

## 2021-02-21 DIAGNOSIS — M7732 Calcaneal spur, left foot: Secondary | ICD-10-CM

## 2021-02-21 MED ORDER — PREDNISONE 20 MG PO TABS: ORAL_TABLET | ORAL | 0 refills | Status: DC

## 2021-02-21 NOTE — ED Triage Notes (Signed)
Pt c/o pain to lt foot/heel x1wk and now having pain to rt knee. Denies injury.

## 2021-02-21 NOTE — Discharge Instructions (Signed)
[facility] ([place] Podiatrist in [place], [place] COVID-19 info: [email or web address] Get online care: [email or web address] Address: [address], [place], [place] [address] Phone: [phone] Appointments: [email or web address]   [facility], [place], [place] Doctor in [place], [place] Address: [address], [place], [place] [address] Phone: [phone]  

## 2021-02-21 NOTE — ED Provider Notes (Signed)
[facility]   MRN: [id number] DOB: 23-Sep-1976  Subjective:   Stacey Blackwell is a 45 y.o. female presenting for 2-week history of persistent and worsening left-sided heel pain with swelling.  Patient states that she has had significant difficulty walking, is now having pain of the right knee as she is trying to bear more weight on that leg.  Has used over-the-counter pain medications without any relief.  Denies falls, trauma, bruising, warmth, erythema.  No history of gout.  No current facility-administered medications for this encounter.  Current Outpatient Medications:  .  naproxen (NAPROSYN) 500 MG tablet, Take 1 tablet (500 mg total) by mouth 2 (two) times daily with a meal., Disp: 30 tablet, Rfl: 0   No Known Allergies  Past Medical History:  Diagnosis Date  . Gallstones      Past Surgical History:  Procedure Laterality Date  . CHOLECYSTECTOMY    . UPPER GASTROINTESTINAL ENDOSCOPY  04/27/2020    Family History  Problem Relation Age of Onset  . Healthy Mother   . Colon cancer Neg Hx   . Esophageal cancer Neg Hx   . Rectal cancer Neg Hx   . Stomach cancer Neg Hx     Social History   Tobacco Use  . Smoking status: Never Smoker  . Smokeless tobacco: Never Used  Vaping Use  . Vaping Use: Never used  Substance Use Topics  . Alcohol use: Yes    Comment: occasional  . Drug use: Never    ROS   Objective:   Vitals: BP 111/77 (BP Location: Left Arm)   Pulse 72   Temp 97.7 F (36.5 C) (Oral)   Resp 18   LMP 03/04/2017   SpO2 98%   Physical Exam Constitutional:      General: She is not in acute distress.    Appearance: Normal appearance. She is well-developed. She is not ill-appearing, toxic-appearing or diaphoretic.  HENT:     Head: Normocephalic and atraumatic.     Nose: Nose normal.     Mouth/Throat:     Mouth: Mucous membranes are moist.     Pharynx: Oropharynx is clear.  Eyes:     General: No scleral icterus.       Right  eye: No discharge.        Left eye: No discharge.     Extraocular Movements: Extraocular movements intact.     Conjunctiva/sclera: Conjunctivae normal.     Pupils: Pupils are equal, round, and reactive to light.  Cardiovascular:     Rate and Rhythm: Normal rate.  Pulmonary:     Effort: Pulmonary effort is normal.  Musculoskeletal:       Feet:  Skin:    General: Skin is warm and dry.  Neurological:     General: No focal deficit present.     Mental Status: She is alert and oriented to person, place, and time.     Motor: No weakness.     Coordination: Coordination normal.     Gait: Gait normal.     Deep Tendon Reflexes: Reflexes normal.  Psychiatric:        Mood and Affect: Mood normal.        Behavior: Behavior normal.        Thought Content: Thought content normal.        Judgment: Judgment normal.     DG Os Calcis Left  Result Date: 02/21/2021 CLINICAL DATA:  Left heel pain and swelling for 2 weeks. No known injury.  EXAM: LEFT OS CALCIS - 2+ VIEW COMPARISON:  None. FINDINGS: There is no evidence of fracture or other focal bone lesions. Small dorsal and plantar calcaneal spurs are noted. Soft tissues are unremarkable. IMPRESSION: Small dorsal and plantar calcaneal spurs.  Otherwise negative. Electronically Signed   By: Drusilla Kanner M.D.   On: 02/21/2021 15:09    Assessment and Plan :   PDMP not reviewed this encounter.  1. Left foot pain   2. Intractable left heel pain   3. Heel spur, left     Patient has failed treatment with NSAIDs.  Therefore will use a prednisone course.  Follow-up with podiatry ASAP.  Counseled on general management of heel pain. Counseled patient on potential for adverse effects with medications prescribed/recommended today, ER and return-to-clinic precautions discussed, patient verbalized understanding.    Wallis Bamberg, [place] 02/21/21 1541

## 2021-02-28 ENCOUNTER — Ambulatory Visit: Payer: Managed Care, Other (non HMO) | Admitting: Podiatry

## 2021-03-07 ENCOUNTER — Ambulatory Visit: Payer: Managed Care, Other (non HMO) | Admitting: Podiatry

## 2022-01-16 ENCOUNTER — Emergency Department (HOSPITAL_COMMUNITY): Payer: Managed Care, Other (non HMO)

## 2022-01-16 ENCOUNTER — Other Ambulatory Visit: Payer: Self-pay

## 2022-01-16 ENCOUNTER — Encounter (HOSPITAL_BASED_OUTPATIENT_CLINIC_OR_DEPARTMENT_OTHER): Payer: Self-pay | Admitting: Urology

## 2022-01-16 ENCOUNTER — Emergency Department (HOSPITAL_BASED_OUTPATIENT_CLINIC_OR_DEPARTMENT_OTHER): Payer: Managed Care, Other (non HMO)

## 2022-01-16 ENCOUNTER — Observation Stay (HOSPITAL_BASED_OUTPATIENT_CLINIC_OR_DEPARTMENT_OTHER)
Admission: EM | Admit: 2022-01-16 | Discharge: 2022-01-17 | Disposition: A | Discharge status: Home / Self Care | Payer: Managed Care, Other (non HMO) | Attending: General Surgery | Admitting: General Surgery

## 2022-01-16 DIAGNOSIS — Z20822 Contact with and (suspected) exposure to covid-19: Secondary | ICD-10-CM | POA: Insufficient documentation

## 2022-01-16 DIAGNOSIS — K3580 Unspecified acute appendicitis: Secondary | ICD-10-CM | POA: Diagnosis not present

## 2022-01-16 DIAGNOSIS — R200 Anesthesia of skin: Secondary | ICD-10-CM | POA: Diagnosis not present

## 2022-01-16 DIAGNOSIS — R519 Headache, unspecified: Secondary | ICD-10-CM | POA: Insufficient documentation

## 2022-01-16 DIAGNOSIS — R1031 Right lower quadrant pain: Secondary | ICD-10-CM | POA: Diagnosis present

## 2022-01-16 LAB — CBC WITH DIFFERENTIAL/PLATELET
Abs Immature Granulocytes: 0.03 10*3/uL (ref 0.00–0.07)
Basophils Absolute: 0 10*3/uL (ref 0.0–0.1)
Basophils Relative: 1 %
Eosinophils Absolute: 0.2 10*3/uL (ref 0.0–0.5)
Eosinophils Relative: 3 %
HCT: 37.6 % (ref 36.0–46.0)
Hemoglobin: 12.9 g/dL (ref 12.0–15.0)
Immature Granulocytes: 1 %
Lymphocytes Relative: 39 %
Lymphs Abs: 2.5 10*3/uL (ref 0.7–4.0)
MCH: 28 pg (ref 26.0–34.0)
MCHC: 34.3 g/dL (ref 30.0–36.0)
MCV: 81.7 fL (ref 80.0–100.0)
Monocytes Absolute: 0.7 10*3/uL (ref 0.1–1.0)
Monocytes Relative: 10 %
Neutro Abs: 3.1 10*3/uL (ref 1.7–7.7)
Neutrophils Relative %: 46 %
Platelets: 304 10*3/uL (ref 150–400)
RBC: 4.6 MIL/uL (ref 3.87–5.11)
RDW: 12.6 % (ref 11.5–15.5)
WBC: 6.5 10*3/uL (ref 4.0–10.5)
nRBC: 0 % (ref 0.0–0.2)

## 2022-01-16 LAB — BASIC METABOLIC PANEL
Anion gap: 7 (ref 5–15)
BUN: 13 mg/dL (ref 6–20)
CO2: 25 mmol/L (ref 22–32)
Calcium: 8.7 mg/dL — ABNORMAL LOW (ref 8.9–10.3)
Chloride: 103 mmol/L (ref 98–111)
Creatinine, Ser: 0.87 mg/dL (ref 0.44–1.00)
GFR, Estimated: >60 mL/min (ref >60)
Glucose, Bld: 102 mg/dL — ABNORMAL HIGH (ref 70–99)
Potassium: 3.7 mmol/L (ref 3.5–5.1)
Sodium: 135 mmol/L (ref 135–145)

## 2022-01-16 LAB — RESP PANEL BY RT-PCR (FLU A&B, COVID) ARPGX2
Influenza A by PCR: NEGATIVE
Influenza B by PCR: NEGATIVE
SARS Coronavirus 2 by RT PCR: NEGATIVE

## 2022-01-16 MED ORDER — METRONIDAZOLE 500 MG/100ML IV SOLN
500.0000 mg | Freq: Once | INTRAVENOUS | Status: AC
  Administered 2022-01-16: 23:00:00 500 mg via INTRAVENOUS
  Filled 2022-01-16: qty 100

## 2022-01-16 MED ORDER — ACETAMINOPHEN 500 MG PO TABS
1000.0000 mg | ORAL_TABLET | Freq: Four times a day (QID) | ORAL | Status: DC
  Administered 2022-01-17 (×2): 1000 mg via ORAL
  Filled 2022-01-16 (×2): qty 2

## 2022-01-16 MED ORDER — OXYCODONE HCL 5 MG PO TABS: 5.0000 mg | ORAL_TABLET | Freq: Four times a day (QID) | ORAL | Status: DC | PRN

## 2022-01-16 MED ORDER — DIPHENHYDRAMINE HCL 50 MG/ML IJ SOLN: 25.0000 mg | Freq: Four times a day (QID) | INTRAMUSCULAR | Status: DC | PRN

## 2022-01-16 MED ORDER — TRAMADOL HCL 50 MG PO TABS: 50.0000 mg | ORAL_TABLET | Freq: Four times a day (QID) | ORAL | Status: DC | PRN

## 2022-01-16 MED ORDER — DIPHENHYDRAMINE HCL 25 MG PO CAPS: 25.0000 mg | ORAL_CAPSULE | Freq: Four times a day (QID) | ORAL | Status: DC | PRN

## 2022-01-16 MED ORDER — SODIUM CHLORIDE 0.9 % IV SOLN: 2.0000 g | INTRAVENOUS | Status: DC

## 2022-01-16 MED ORDER — HYDROMORPHONE HCL 1 MG/ML IJ SOLN: 0.5000 mg | INTRAMUSCULAR | Status: DC | PRN

## 2022-01-16 MED ORDER — DIPHENHYDRAMINE HCL 50 MG/ML IJ SOLN
12.5000 mg | Freq: Once | INTRAMUSCULAR | Status: AC
  Administered 2022-01-16: 21:00:00 12.5 mg via INTRAVENOUS
  Filled 2022-01-16: qty 1

## 2022-01-16 MED ORDER — SIMETHICONE 80 MG PO CHEW: 40.0000 mg | CHEWABLE_TABLET | Freq: Four times a day (QID) | ORAL | Status: DC | PRN

## 2022-01-16 MED ORDER — IOHEXOL 300 MG/ML  SOLN
100.0000 mL | Freq: Once | INTRAMUSCULAR | Status: AC | PRN
  Administered 2022-01-16: 22:00:00 100 mL via INTRAVENOUS

## 2022-01-16 MED ORDER — IBUPROFEN 400 MG PO TABS: 600.0000 mg | ORAL_TABLET | Freq: Four times a day (QID) | ORAL | Status: DC | PRN

## 2022-01-16 MED ORDER — PROCHLORPERAZINE EDISYLATE 10 MG/2ML IJ SOLN
10.0000 mg | Freq: Once | INTRAMUSCULAR | Status: AC
  Administered 2022-01-16: 21:00:00 10 mg via INTRAVENOUS
  Filled 2022-01-16: qty 2

## 2022-01-16 MED ORDER — SODIUM CHLORIDE 0.9 % IV SOLN: INTRAVENOUS | Status: DC

## 2022-01-16 MED ORDER — ENOXAPARIN SODIUM 40 MG/0.4ML IJ SOSY
40.0000 mg | PREFILLED_SYRINGE | Freq: Every day | INTRAMUSCULAR | Status: DC
  Administered 2022-01-17: 40 mg via SUBCUTANEOUS
  Filled 2022-01-16: qty 0.4

## 2022-01-16 MED ORDER — METRONIDAZOLE 500 MG/100ML IV SOLN
500.0000 mg | Freq: Two times a day (BID) | INTRAVENOUS | Status: DC
  Administered 2022-01-17: 12:00:00 500 mg via INTRAVENOUS
  Filled 2022-01-16: qty 100

## 2022-01-16 MED ORDER — DOCUSATE SODIUM 100 MG PO CAPS
100.0000 mg | ORAL_CAPSULE | Freq: Two times a day (BID) | ORAL | Status: DC
  Administered 2022-01-17: 100 mg via ORAL
  Filled 2022-01-16: qty 1

## 2022-01-16 MED ORDER — SODIUM CHLORIDE 0.9 % IV SOLN
2.0000 g | Freq: Once | INTRAVENOUS | Status: AC
  Administered 2022-01-16: 23:00:00 2 g via INTRAVENOUS
  Filled 2022-01-16: qty 20

## 2022-01-16 MED ORDER — ONDANSETRON 4 MG PO TBDP: 4.0000 mg | ORAL_TABLET | Freq: Four times a day (QID) | ORAL | Status: DC | PRN

## 2022-01-16 MED ORDER — METHOCARBAMOL 500 MG PO TABS: 500.0000 mg | ORAL_TABLET | Freq: Four times a day (QID) | ORAL | Status: DC | PRN

## 2022-01-16 MED ORDER — ONDANSETRON HCL 4 MG/2ML IJ SOLN: 4.0000 mg | Freq: Four times a day (QID) | INTRAMUSCULAR | Status: DC | PRN

## 2022-01-16 MED ORDER — SODIUM CHLORIDE 0.9 % IV BOLUS
500.0000 mL | Freq: Once | INTRAVENOUS | Status: AC
  Administered 2022-01-16: 21:00:00 500 mL via INTRAVENOUS

## 2022-01-16 NOTE — H&P (Signed)
Surgical Evaluation Requesting provider: Dr. Lennice Sites  Chief Complaint: Right-sided numbness, RLQ abdominal pain  HPI: 46 year old woman who presented to the [facility] emergency room this afternoon with complaints of headache, fatigue, and numbness of the right lower lip, which radiated up the right side of the face and right shoulder with tingling.  This occurred about a month ago as well and resolved spontaneously after a few hours.  Has also been experiencing intermittent frontal headaches for the last 2 months as well as intermittent dizziness, her eyes feeling heavier than normal, and also, right lower quadrant abdominal pain which began early this morning.  She was found on initial evaluation to have altered sensation to the right side of her face, right upper extremity, right lower extremity and right upper back.  Was transferred to [facility] for further work-up.  Had a CT scan of her head as well as an MRI of her brain both of which were negative for acute abnormality.  Ultimately was noted to have some right lower quadrant abdominal pain and tenderness, and underwent a CT scan of the pelvis demonstrating uncomplicated appendicitis.  Currently reports that the numbness and tingling has resolved   No Known Allergies  Past Medical History:  Diagnosis Date   Gallstones     Past Surgical History:  Procedure Laterality Date   CHOLECYSTECTOMY     UPPER GASTROINTESTINAL ENDOSCOPY  04/27/2020    Family History  Problem Relation Age of Onset   Healthy Mother    Colon cancer Neg Hx    Esophageal cancer Neg Hx    Rectal cancer Neg Hx    Stomach cancer Neg Hx     Social History   Socioeconomic History   Marital status: Single    Spouse name: Not on file   Number of children: 3   Years of education: Not on file   Highest education level: Not on file  Occupational History   Not on file  Tobacco Use   Smoking status: Never   Smokeless tobacco: Never  Vaping Use    Vaping Use: Never used  Substance and Sexual Activity   Alcohol use: Yes    Comment: occasional   Drug use: Never   Sexual activity: Yes    Birth control/protection: None  Other Topics Concern   Not on file  Social History Narrative   ** Merged History Encounter **       Social Determinants of Health   Financial Resource Strain: Not on file  Food Insecurity: Not on file  Transportation Needs: Not on file  Physical Activity: Not on file  Stress: Not on file  Social Connections: Not on file    No current facility-administered medications on file prior to encounter.   Current Outpatient Medications on File Prior to Encounter  Medication Sig Dispense Refill   ibuprofen (ADVIL) 200 MG tablet Take 800 mg by mouth every 6 (six) hours as needed for headache or moderate pain.     naproxen (NAPROSYN) 500 MG tablet Take 1 tablet (500 mg total) by mouth 2 (two) times daily with a meal. (Patient not taking: Reported on 01/16/2022) 30 tablet 0   predniSONE (DELTASONE) 20 MG tablet Take 2 tablets daily with breakfast. (Patient not taking: Reported on 01/16/2022) 10 tablet 0   [DISCONTINUED] cetirizine (ZYRTEC ALLERGY) 10 MG tablet Take 1 tablet (10 mg total) by mouth daily. (Patient not taking: Reported on 03/04/2017) 30 tablet 1   [DISCONTINUED] fluticasone (FLONASE) 50 MCG/ACT nasal  spray Place 2 sprays into both nostrils daily. (Patient not taking: Reported on 03/04/2017) 16 g 0    Review of Systems: a complete, 10pt review of systems was completed with pertinent positives and negatives as documented in the HPI  Physical Exam: Vitals:   01/16/22 2220 01/16/22 2330  BP: 118/70 (!) 101/55  Pulse: 64 64  Resp: 12 15  Temp:    SpO2: 98% 100%   Gen: A&Ox3, no distress  Chest: respiratory effort is normal. No crepitus or tenderness on palpation of the chest. Breath sounds equal.  Cardiovascular: RRR with palpable distal pulses, no pedal edema Gastrointestinal: soft, nondistended, tender  in the right lower quadrant. Neuro: Grossly normal Psych: appropriate mood and affect, normal insight/judgment intact  Skin: warm and dry   CBC Latest Ref Rng & Units 01/16/2022 03/04/2020 04/24/2018  WBC 4.0 - 10.5 K/uL 6.5 8.4 8.3  Hemoglobin 12.0 - 15.0 g/dL 12.9 13.9 11.2(L)  Hematocrit 36.0 - 46.0 % 37.6 41.1 35.4(L)  Platelets 150 - 400 K/uL 304 382 365    CMP Latest Ref Rng & Units 01/16/2022 03/04/2020 04/24/2018  Glucose 70 - 99 mg/dL 102(H) 94 80  BUN 6 - 20 mg/dL 13 13 9   Creatinine 0.44 - 1.00 mg/dL 0.87 0.76 0.72  Sodium 135 - 145 mmol/L 135 138 138  Potassium 3.5 - 5.1 mmol/L 3.7 3.6 3.7  Chloride 98 - 111 mmol/L 103 103 103  CO2 22 - 32 mmol/L 25 24 22   Calcium 8.9 - 10.3 mg/dL 8.7(L) 9.5 9.0  Total Protein 6.5 - 8.1 g/dL - 8.1 7.6  Total Bilirubin 0.3 - 1.2 mg/dL - 0.6 0.7  Alkaline Phos 38 - 126 U/L - 73 74  AST 15 - 41 U/L - 36 32  ALT 0 - 44 U/L - 51(H) 23    No results found for: INR, PROTIME  Imaging: CT Head Wo Contrast  Result Date: 01/16/2022 CLINICAL DATA:  Right facial numbness EXAM: CT HEAD WITHOUT CONTRAST TECHNIQUE: Contiguous axial images were obtained from the base of the skull through the vertex without intravenous contrast. RADIATION DOSE REDUCTION: This exam was performed according to the departmental dose-optimization program which includes automated exposure control, adjustment of the mA and/or kV according to patient size and/or use of iterative reconstruction technique. COMPARISON:  04/24/2018 FINDINGS: Brain: No acute intracranial abnormality. Specifically, no hemorrhage, hydrocephalus, mass lesion, acute infarction, or significant intracranial injury. Vascular: No hyperdense vessel or unexpected calcification. Skull: No acute calvarial abnormality. Sinuses/Orbits: No acute findings Other: None IMPRESSION: Normal study. Electronically Signed   By: Rolm Baptise M.D.   On: 01/16/2022 18:12   MR BRAIN WO CONTRAST  Result Date: 01/16/2022 CLINICAL  DATA:  Right lower lip numbness, headache, fatigue, stroke suspected EXAM: MRI HEAD WITHOUT CONTRAST TECHNIQUE: Multiplanar, multiecho pulse sequences of the brain and surrounding structures were obtained without intravenous contrast. COMPARISON:  No prior MRI, correlation is made with CT head 01/16/2022 FINDINGS: Brain: No restricted diffusion to suggest acute or subacute infarct. No acute hemorrhage, mass, mass effect, or midline shift. No foci of hemosiderin deposition to suggest remote hemorrhage. No hydrocephalus or extra-axial collection. No abnormal T2 hyperintense signal in the periventricular white matter. Vascular: Normal flow voids. Skull and upper cervical spine: Normal marrow signal. Sinuses/Orbits: Negative. Other: The mastoids are well aerated. IMPRESSION: Normal MRI of the brain. Electronically Signed   By: Merilyn Baba M.D.   On: 01/16/2022 21:57   CT ABDOMEN PELVIS W CONTRAST  Result Date: 01/16/2022 CLINICAL  DATA:  RLQ abdominal pain (Age >= 14y) rlq pain EXAM: CT ABDOMEN AND PELVIS WITH CONTRAST TECHNIQUE: Multidetector CT imaging of the abdomen and pelvis was performed using the standard protocol following bolus administration of intravenous contrast. RADIATION DOSE REDUCTION: This exam was performed according to the departmental dose-optimization program which includes automated exposure control, adjustment of the mA and/or kV according to patient size and/or use of iterative reconstruction technique. CONTRAST:  144mL OMNIPAQUE IOHEXOL 300 MG/ML  SOLN COMPARISON:  None. FINDINGS: Lower chest: Clear lung bases.  No acute airspace disease. Hepatobiliary: Hepatic steatosis without focal hepatic lesion. Clips in the gallbladder fossa postcholecystectomy. No biliary dilatation. Pancreas: Unremarkable. No pancreatic ductal dilatation or surrounding inflammatory changes. Spleen: Normal in size without focal abnormality. Adrenals/Urinary Tract: Normal adrenal glands. No hydronephrosis or  perinephric edema. Homogeneous renal enhancement with symmetric excretion on delayed phase imaging. No evidence of focal renal lesion or stone. Urinary bladder is physiologically distended without wall thickening. Stomach/Bowel: The appendix is dilated at 9 mm, small amount of intraluminal fluid. Mild periappendiceal fat stranding. No appendicolith. No abscess or perforation. There are small adjacent periappendiceal lymph nodes, likely reactive. The stomach, small bowel and colon are unremarkable. No bowel obstruction or inflammatory change. Vascular/Lymphatic: Normal caliber abdominal aorta. No portal venous or mesenteric gas. Patent portal vein. Small peri cecal/peri appendiceal nodes, typically reactive. No other enlarged lymph nodes in the abdomen or pelvis. Reproductive: Uterus and bilateral adnexa are unremarkable. Other: No free air, free fluid, or intra-abdominal fluid collection. Small fat containing umbilical hernia. Musculoskeletal: There are no acute or suspicious osseous abnormalities. IMPRESSION: 1. Findings consistent with early uncomplicated acute appendicitis. 2. Hepatic steatosis. 3. Small fat containing umbilical hernia. Electronically Signed   By: Keith Rake M.D.   On: 01/16/2022 22:27     A/P: Acute appendicitis by CT scan, in setting of some transient but otherwise unexplained neurologic findings, work-up negative so far.  Hospitalist declined admission.  Neurologist will see and make further recommendations, but initial impression is that no further neurologic work-up is necessary or indicated.  We will tentatively plan appendectomy tomorrow with Dr. Donne Hazel, . We discussed the surgery including risks of bleeding, infection, pain, scarring, injury to intra-abdominal structures, conversion to open surgery or more extensive resection, risk of staple line leak or delayed abscess, failure to resolve symptoms, postoperative ileus, incisional hernia, as well as general risks of DVT/PE,  pneumonia, stroke, heart attack, death. Questions were welcomed and answered to the patient's satisfaction.      Patient Active Problem List   Diagnosis Date Noted   Acute appendicitis 01/16/2022   Abdominal pain, epigastric 04/18/2020       Romana Juniper, MD [facility]  See AMION to contact appropriate on-call provider

## 2022-01-16 NOTE — ED Notes (Signed)
Patient transported to MRI 

## 2022-01-16 NOTE — ED Notes (Signed)
Report given to Charge RN at [facility] ED and to [facility]. All questions answered

## 2022-01-16 NOTE — ED Triage Notes (Signed)
Right lower lip numbness that started yesterday  HR elevated to 154 last night , wnl now  States Headache and fatigue as well

## 2022-01-16 NOTE — ED Provider Notes (Addendum)
Patient transferred for MRI to rule out stroke.  She has been having headaches on and off for the past month.  Having some right-sided facial numbness now.  May be had some in her arms and legs but does not have that anymore.  She still has a headache.  She states that she has had persistent right lower quadrant abdominal pain since 1:00 last night.  She had a negative head CT.  She is tender in the right lower abdomen.  We will pursue MRI to rule out stroke but overall suspect complex migraine.  Neurologically she is intact except for may be subjective numbness to the right lower face.  She has no weakness.  No vision changes.  I am not concerned for multiple sclerosis.  She is having some tenderness in the right lower quadrant we will get a CT scan to rule out appendicitis, less likely that this is a bowel obstruction or UTI she is not having any pain with urination.  She not having any vaginal discharge or bleeding.  1030 pm, patient with MRI per radiology report with no acute findings.  CT scan abdomen pelvis per radiology report shows acute appendicitis.  Will discuss with general surgery.  Anticipate admission to their service for appendectomy.  IV antibiotics provided given acute appendicitis on CT.  Hemodynamically stable.  She has no fever.  No white count.  Talked with Dr. Doylene Canard with general surgery, and they will evaluate the patient and likely take to the OR tomorrow.  They would like medicine to admit given neurologic symptoms.   I have talked with Dr. Amada Jupiter with neurology who will come evaluate the patient.  My suspicion is that this is likely complex migraines.  But he will leave further recommendations if needed.  We have paged Dr. Fredricka Bonine general surgery who will admit for further care.  This chart was dictated using voice recognition software.  Despite best efforts to proofread,  errors can occur which can change the documentation meaning.    Virgina Norfolk, DO 01/16/22 2254     Virgina Norfolk, DO 01/16/22 2340

## 2022-01-16 NOTE — ED Provider Notes (Signed)
[facility] EMERGENCY DEPARTMENT Provider Note   CSN: [id number] Arrival date & time: 01/16/22  1620     History  Chief Complaint  Patient presents with   Headache   facial numbness     Stacey Blackwell is a 46 y.o. female.  46 year old previously healthy female presents for lower right lip tingling that began early this morning around 1:30 AM at work as she was getting off break (works for [employer]). Was evaluated by clinician at work and was told her BP 154/? and to see urgent care. Reports she took aspirin last night and felt the tingling sensation began to radiate up right side of face and right shoulder tingling.  Reports this happened about a month ago and tingling resolved on its own within a few hours. Also reports intermittent frontal headaches for the past 2 months that are described as squeezing. She uses ibuprofen with relief. Also reports some dizziness (2-3 times weekly) for the past 2 months that occurs in AM after she awakes and resolves on its own.  Also states her eyes feel heavier than normal for the past month and RLQ abdominal pain. Denies chest pain, SHOB, fever, chest symptoms, aphasia and LE weakness/sensation. Denies history of stroke, heart issues, diabetes or immune diseases. Denies recent change in meds/skin products or diet. Son reports she has had an increase in stress in her life since November.      Home Medications Prior to Admission medications   Medication Sig Start Date End Date Taking? Authorizing Provider  naproxen (NAPROSYN) 500 MG tablet Take 1 tablet (500 mg total) by mouth 2 (two) times daily with a meal. 09/06/20   Fawze, Mina A, PA-C  predniSONE (DELTASONE) 20 MG tablet Take 2 tablets daily with breakfast. 02/21/21   Wallis BambergMani, Mario, PA-C  cetirizine (ZYRTEC ALLERGY) 10 MG tablet Take 1 tablet (10 mg total) by mouth daily. Patient not taking: Reported on 03/04/2017 12/12/16 03/03/20  Everlene Farrieransie, William, PA-C  fluticasone [facility](FLONASE) 50  MCG/ACT nasal spray Place 2 sprays into both nostrils daily. Patient not taking: Reported on 03/04/2017 12/12/16 03/03/20  Everlene Farrieransie, William, PA-C      Allergies    Patient has no known allergies.    Review of Systems   Review of Systems  Constitutional:  Negative for chills and fever.  Respiratory:  Negative for shortness of breath.   Cardiovascular:  Negative for chest pain.  Gastrointestinal:  Positive for abdominal pain. Negative for constipation, diarrhea, nausea and vomiting.  Musculoskeletal:  Negative for arthralgias and myalgias.  Skin:  Negative for rash and wound.  Allergic/Immunologic: Negative for immunocompromised state.  Neurological:  Positive for numbness and headaches. Negative for weakness.  Hematological:  Negative for adenopathy.  Psychiatric/Behavioral:  Negative for confusion.   All other systems reviewed and are negative.  Physical Exam Updated Vital Signs BP 108/76 (BP Location: Right Arm)    Pulse 73    Temp 98 F (36.7 C) (Oral)    Resp 16    Ht 5\' 2"  (1.575 m)    Wt 88 kg    LMP 03/04/2017    SpO2 100%    BMI 35.48 kg/m  Physical Exam Vitals and nursing note reviewed.  Constitutional:      General: She is not in acute distress.    Appearance: She is well-developed. She is not diaphoretic.  HENT:     Head: Normocephalic and atraumatic.     Mouth/Throat:     Mouth: Mucous membranes are  moist.  Eyes:     Extraocular Movements: Extraocular movements intact.     Pupils: Pupils are equal, round, and reactive to light.  Cardiovascular:     Rate and Rhythm: Normal rate and regular rhythm.     Heart sounds: Normal heart sounds.  Pulmonary:     Effort: Pulmonary effort is normal.     Breath sounds: Normal breath sounds.  Abdominal:     Palpations: Abdomen is soft.     Tenderness: There is abdominal tenderness in the right lower quadrant.  Musculoskeletal:        General: No swelling or tenderness.     Cervical back: Neck supple.  Skin:    General: Skin  is warm and dry.  Neurological:     Mental Status: She is alert and oriented to person, place, and time.     GCS: GCS eye subscore is 4. GCS verbal subscore is 5. GCS motor subscore is 6.     Cranial Nerves: No facial asymmetry.     Sensory: Sensory deficit present.     Motor: No weakness or pronator drift.     Coordination: Coordination is intact.     Comments: Reports diminished sensation to right side face, right upper back, right arm and leg. Normal/symmetric sensation over chest and abdomen. Symmetric facial movement.  Speech normal.   Psychiatric:        Behavior: Behavior normal.    ED Results / Procedures / Treatments   Labs (all labs ordered are listed, but only abnormal results are displayed) Labs Reviewed  BASIC METABOLIC PANEL - Abnormal; Notable for the following components:      Result Value   Glucose, Bld 102 (*)    Calcium 8.7 (*)    All other components within normal limits  RESP PANEL BY RT-PCR (FLU A&B, COVID) ARPGX2  CBC WITH DIFFERENTIAL/PLATELET    EKG EKG Interpretation  Date/Time:  Tuesday January 16 2022 16:34:29 EST Ventricular Rate:  75 PR Interval:  170 QRS Duration: 82 QT Interval:  376 QTC Calculation: 419 R Axis:   78 Text Interpretation: Normal sinus rhythm Normal ECG When compared with ECG of 24-Apr-2018 17:28, no significant change Confirmed by Marianna Fuss ([id number]) on 01/16/2022 5:40:18 PM  Radiology CT Head Wo Contrast  Result Date: 01/16/2022 CLINICAL DATA:  Right facial numbness EXAM: CT HEAD WITHOUT CONTRAST TECHNIQUE: Contiguous axial images were obtained from the base of the skull through the vertex without intravenous contrast. RADIATION DOSE REDUCTION: This exam was performed according to the departmental dose-optimization program which includes automated exposure control, adjustment of the mA and/or kV according to patient size and/or use of iterative reconstruction technique. COMPARISON:  04/24/2018 FINDINGS: Brain: No acute  intracranial abnormality. Specifically, no hemorrhage, hydrocephalus, mass lesion, acute infarction, or significant intracranial injury. Vascular: No hyperdense vessel or unexpected calcification. Skull: No acute calvarial abnormality. Sinuses/Orbits: No acute findings Other: None IMPRESSION: Normal study. Electronically Signed   By: Charlett Nose M.D.   On: 01/16/2022 18:12    Procedures Procedures    Medications Ordered in ED Medications - No data to display  ED Course/ Medical Decision Making/ A&P                           Medical Decision Making Amount and/or Complexity of Data Reviewed Labs: ordered. Radiology: ordered.   This patient presents to the ED for concern of right side body numbness with headache, this involves an extensive number  of treatment options, and is a complaint that carries with it a high risk of complications and morbidity.  The differential diagnosis includes but not limited to CVA, MS, migraine   Co morbidities that complicate the patient evaluation  Obese, otherwise no significant past medical history   Additional history obtained:  Additional history obtained from patient's son at bedside External records from outside source obtained and reviewed including no recent notes on file   Lab Tests:  I Ordered, and personally interpreted labs.  The pertinent results include: CBC, BMP, COVID.  COVID test pending, other results unremarkable.   Imaging Studies ordered:  I ordered imaging studies including CT head  I agree with the radiologist interpretation   Cardiac Monitoring:  The patient was maintained on a cardiac monitor.  I personally viewed and interpreted the cardiac monitored which showed an underlying rhythm of: Sinus rhythm   Test Considered:  MRI brain    Problem List / ED Course:  46 year old female with complaint of right side body numbness onset 1:30 AM today while at work.  Reports associated frontal headache which has been  an intermittent an ongoing problem for the past 2 months.  On exam, she is found to have altered sensation to her right side face, right upper extremity, right lower extremity as well as right upper back.  No arm drift, no leg drift.  No facial asymmetry. CT head is unremarkable.  Lab work is unrevealing.  I discussed with Dr. Stevie Kern, ER attending, recommends transfer to the [facility], ER for MRI brain.  Discussed with Dr. Lockie Mola, ER attending at [facility] who accepts in transfer for MRI.   Reevaluation:  After the interventions noted above, I reevaluated the patient and found that they have :stayed the same    Dispostion:  After consideration of the diagnostic results and the patients response to treatment, I feel that the patent would benefit from ED to ED transfer to [facility] for MRI brain.          Final Clinical Impression(s) / ED Diagnoses Final diagnoses:  Acute nonintractable headache, unspecified headache type  Numbness    Rx / DC Orders ED Discharge Orders     None         Alden Hipp 01/16/22 1924    Milagros Loll, MD 01/16/22 2132

## 2022-01-16 NOTE — ED Notes (Signed)
Pt bib [facility] from [facility] for MRI. Pt c/o R lip numbness, headache and fatigue since yesterday. CT scan and lab results unremarkable per [facility]. Pt with R sided weakness and sensory deficits on right side on arrival.

## 2022-01-17 ENCOUNTER — Encounter (HOSPITAL_COMMUNITY)
Admission: EM | Disposition: A | Discharge status: Home / Self Care | Payer: Self-pay | Source: Home / Self Care | Attending: Emergency Medicine

## 2022-01-17 ENCOUNTER — Observation Stay (HOSPITAL_COMMUNITY): Payer: Managed Care, Other (non HMO) | Admitting: Certified Registered Nurse Anesthetist

## 2022-01-17 ENCOUNTER — Encounter (HOSPITAL_COMMUNITY): Payer: Self-pay

## 2022-01-17 ENCOUNTER — Other Ambulatory Visit: Payer: Self-pay

## 2022-01-17 DIAGNOSIS — G43101 Migraine with aura, not intractable, with status migrainosus: Secondary | ICD-10-CM

## 2022-01-17 HISTORY — PX: LAPAROSCOPIC APPENDECTOMY: SHX408

## 2022-01-17 LAB — CBC
HCT: 37.2 % (ref 36.0–46.0)
Hemoglobin: 12.8 g/dL (ref 12.0–15.0)
MCH: 28.7 pg (ref 26.0–34.0)
MCHC: 34.4 g/dL (ref 30.0–36.0)
MCV: 83.4 fL (ref 80.0–100.0)
Platelets: 295 10*3/uL (ref 150–400)
RBC: 4.46 MIL/uL (ref 3.87–5.11)
RDW: 12.4 % (ref 11.5–15.5)
WBC: 5.9 10*3/uL (ref 4.0–10.5)
nRBC: 0 % (ref 0.0–0.2)

## 2022-01-17 LAB — URINALYSIS, ROUTINE W REFLEX MICROSCOPIC
Bilirubin Urine: NEGATIVE
Glucose, UA: NEGATIVE mg/dL
Hgb urine dipstick: NEGATIVE
Ketones, ur: NEGATIVE mg/dL
Nitrite: NEGATIVE
Protein, ur: NEGATIVE mg/dL
Specific Gravity, Urine: >1.046 — ABNORMAL HIGH (ref 1.005–1.030)
pH: 6 (ref 5.0–8.0)

## 2022-01-17 LAB — BASIC METABOLIC PANEL
Anion gap: 7 (ref 5–15)
BUN: 7 mg/dL (ref 6–20)
CO2: 24 mmol/L (ref 22–32)
Calcium: 8.1 mg/dL — ABNORMAL LOW (ref 8.9–10.3)
Chloride: 106 mmol/L (ref 98–111)
Creatinine, Ser: 0.65 mg/dL (ref 0.44–1.00)
GFR, Estimated: >60 mL/min (ref >60)
Glucose, Bld: 97 mg/dL (ref 70–99)
Potassium: 3.6 mmol/L (ref 3.5–5.1)
Sodium: 137 mmol/L (ref 135–145)

## 2022-01-17 SURGERY — APPENDECTOMY, LAPAROSCOPIC
Anesthesia: General

## 2022-01-17 MED ORDER — SUGAMMADEX SODIUM 200 MG/2ML IV SOLN
INTRAVENOUS | Status: DC | PRN
Start: 2022-01-17 — End: 2022-01-17
  Administered 2022-01-17: 200 mg via INTRAVENOUS

## 2022-01-17 MED ORDER — LACTATED RINGERS IV SOLN: INTRAVENOUS | Status: DC

## 2022-01-17 MED ORDER — IBUPROFEN 200 MG PO TABS: 800.0000 mg | ORAL_TABLET | Freq: Three times a day (TID) | ORAL | 0 refills | Status: AC | PRN

## 2022-01-17 MED ORDER — HYDROMORPHONE HCL 1 MG/ML IJ SOLN: 0.2500 mg | INTRAMUSCULAR | Status: DC | PRN

## 2022-01-17 MED ORDER — FENTANYL CITRATE (PF) 250 MCG/5ML IJ SOLN
INTRAMUSCULAR | Status: AC
  Filled 2022-01-17: qty 5

## 2022-01-17 MED ORDER — MIDAZOLAM HCL 2 MG/2ML IJ SOLN
INTRAMUSCULAR | Status: DC | PRN
  Administered 2022-01-17: 2 mg via INTRAVENOUS

## 2022-01-17 MED ORDER — AMISULPRIDE (ANTIEMETIC) 5 MG/2ML IV SOLN: 10.0000 mg | Freq: Once | INTRAVENOUS | Status: DC | PRN

## 2022-01-17 MED ORDER — ROCURONIUM BROMIDE 10 MG/ML (PF) SYRINGE
PREFILLED_SYRINGE | INTRAVENOUS | Status: AC
  Filled 2022-01-17: qty 10

## 2022-01-17 MED ORDER — BUPIVACAINE-EPINEPHRINE 0.25% -1:200000 IJ SOLN
INTRAMUSCULAR | Status: DC | PRN
  Administered 2022-01-17: 6 mL

## 2022-01-17 MED ORDER — PROMETHAZINE HCL 25 MG/ML IJ SOLN: 6.2500 mg | INTRAMUSCULAR | Status: DC | PRN

## 2022-01-17 MED ORDER — MEPERIDINE HCL 25 MG/ML IJ SOLN: 6.2500 mg | INTRAMUSCULAR | Status: DC | PRN

## 2022-01-17 MED ORDER — BUPIVACAINE-EPINEPHRINE (PF) 0.25% -1:200000 IJ SOLN
INTRAMUSCULAR | Status: AC
  Filled 2022-01-17: qty 30

## 2022-01-17 MED ORDER — ROCURONIUM BROMIDE 10 MG/ML (PF) SYRINGE
PREFILLED_SYRINGE | INTRAVENOUS | Status: DC | PRN
  Administered 2022-01-17: 40 mg via INTRAVENOUS

## 2022-01-17 MED ORDER — DEXAMETHASONE SODIUM PHOSPHATE 10 MG/ML IJ SOLN
INTRAMUSCULAR | Status: DC | PRN
  Administered 2022-01-17: 10 mg via INTRAVENOUS

## 2022-01-17 MED ORDER — PROPOFOL 10 MG/ML IV BOLUS
INTRAVENOUS | Status: DC | PRN
  Administered 2022-01-17: 200 mg via INTRAVENOUS

## 2022-01-17 MED ORDER — SODIUM CHLORIDE 0.9 % IR SOLN
Status: DC | PRN
  Administered 2022-01-17: 1000 mL

## 2022-01-17 MED ORDER — ORAL CARE MOUTH RINSE: 15.0000 mL | Freq: Once | OROMUCOSAL | Status: AC

## 2022-01-17 MED ORDER — OXYCODONE HCL 5 MG/5ML PO SOLN: 5.0000 mg | Freq: Once | ORAL | Status: DC | PRN

## 2022-01-17 MED ORDER — KETOROLAC TROMETHAMINE 30 MG/ML IJ SOLN
INTRAMUSCULAR | Status: AC
  Filled 2022-01-17: qty 1

## 2022-01-17 MED ORDER — 0.9 % SODIUM CHLORIDE (POUR BTL) OPTIME
TOPICAL | Status: DC | PRN
Start: 2022-01-17 — End: 2022-01-17
  Administered 2022-01-17: 12:00:00 1000 mL

## 2022-01-17 MED ORDER — SUCCINYLCHOLINE CHLORIDE 200 MG/10ML IV SOSY
PREFILLED_SYRINGE | INTRAVENOUS | Status: AC
  Filled 2022-01-17: qty 10

## 2022-01-17 MED ORDER — DEXAMETHASONE SODIUM PHOSPHATE 10 MG/ML IJ SOLN
INTRAMUSCULAR | Status: AC
  Filled 2022-01-17: qty 1

## 2022-01-17 MED ORDER — BUPIVACAINE HCL 0.5 % IJ SOLN
INTRAMUSCULAR | Status: DC | PRN
  Administered 2022-01-17: 30 mL

## 2022-01-17 MED ORDER — LIDOCAINE 2% (20 MG/ML) 5 ML SYRINGE
INTRAMUSCULAR | Status: DC | PRN
Start: 2022-01-17 — End: 2022-01-17
  Administered 2022-01-17: 60 mg via INTRAVENOUS

## 2022-01-17 MED ORDER — PHENYLEPHRINE HCL-NACL 20-0.9 MG/250ML-% IV SOLN
INTRAVENOUS | Status: DC | PRN
  Administered 2022-01-17: 25 ug/min via INTRAVENOUS

## 2022-01-17 MED ORDER — ACETAMINOPHEN 500 MG PO TABS: 1000.0000 mg | ORAL_TABLET | Freq: Three times a day (TID) | ORAL | 0 refills | Status: AC | PRN

## 2022-01-17 MED ORDER — OXYCODONE HCL 5 MG PO TABS: 5.0000 mg | ORAL_TABLET | Freq: Four times a day (QID) | ORAL | 0 refills | Status: AC | PRN

## 2022-01-17 MED ORDER — FENTANYL CITRATE (PF) 250 MCG/5ML IJ SOLN
INTRAMUSCULAR | Status: DC | PRN
  Administered 2022-01-17: 50 ug via INTRAVENOUS

## 2022-01-17 MED ORDER — ONDANSETRON HCL 4 MG/2ML IJ SOLN
INTRAMUSCULAR | Status: DC | PRN
  Administered 2022-01-17: 4 mg via INTRAVENOUS

## 2022-01-17 MED ORDER — CHLORHEXIDINE GLUCONATE 0.12 % MT SOLN
OROMUCOSAL | Status: AC
  Administered 2022-01-17: 11:00:00 15 mL via OROMUCOSAL
  Filled 2022-01-17: qty 15

## 2022-01-17 MED ORDER — SUCCINYLCHOLINE CHLORIDE 200 MG/10ML IV SOSY
PREFILLED_SYRINGE | INTRAVENOUS | Status: DC | PRN
Start: 2022-01-17 — End: 2022-01-17
  Administered 2022-01-17: 140 mg via INTRAVENOUS

## 2022-01-17 MED ORDER — CHLORHEXIDINE GLUCONATE 0.12 % MT SOLN: 15.0000 mL | Freq: Once | OROMUCOSAL | Status: AC

## 2022-01-17 MED ORDER — BUPIVACAINE LIPOSOME 1.3 % IJ SUSP
INTRAMUSCULAR | Status: DC | PRN
  Administered 2022-01-17: 10 mL via PERINEURAL

## 2022-01-17 MED ORDER — MIDAZOLAM HCL 2 MG/2ML IJ SOLN
INTRAMUSCULAR | Status: AC
  Filled 2022-01-17: qty 2

## 2022-01-17 MED ORDER — OXYCODONE HCL 5 MG PO TABS: 5.0000 mg | ORAL_TABLET | Freq: Once | ORAL | Status: DC | PRN

## 2022-01-17 MED ORDER — KETOROLAC TROMETHAMINE 30 MG/ML IJ SOLN: 30.0000 mg | Freq: Once | INTRAMUSCULAR | Status: DC | PRN

## 2022-01-17 MED ORDER — LIDOCAINE 2% (20 MG/ML) 5 ML SYRINGE
INTRAMUSCULAR | Status: AC
  Filled 2022-01-17: qty 5

## 2022-01-17 MED ORDER — ONDANSETRON HCL 4 MG/2ML IJ SOLN
INTRAMUSCULAR | Status: AC
  Filled 2022-01-17: qty 2

## 2022-01-17 SURGICAL SUPPLY — 42 items
APPLIER CLIP 5 13 M/L LIGAMAX5 (MISCELLANEOUS)
BAG COUNTER SPONGE SURGICOUNT (BAG) ×2 IMPLANT
CANISTER SUCT 3000ML PPV (MISCELLANEOUS) ×2 IMPLANT
CHLORAPREP W/TINT 26 (MISCELLANEOUS) ×2 IMPLANT
CLIP APPLIE 5 13 M/L LIGAMAX5 (MISCELLANEOUS) IMPLANT
COVER SURGICAL LIGHT HANDLE (MISCELLANEOUS) ×2 IMPLANT
CUTTER FLEX LINEAR 45M (STAPLE) ×2 IMPLANT
DERMABOND ADVANCED (GAUZE/BANDAGES/DRESSINGS) ×1
DERMABOND ADVANCED .7 DNX12 (GAUZE/BANDAGES/DRESSINGS) ×1 IMPLANT
ELECT REM PT RETURN 9FT ADLT (ELECTROSURGICAL) ×2
ELECTRODE REM PT RTRN 9FT ADLT (ELECTROSURGICAL) ×1 IMPLANT
GLOVE SURG ENC MOIS LTX SZ7 (GLOVE) ×2 IMPLANT
GLOVE SURG UNDER POLY LF SZ7.5 (GLOVE) ×2 IMPLANT
GOWN STRL REUS W/ TWL LRG LVL3 (GOWN DISPOSABLE) ×3 IMPLANT
GOWN STRL REUS W/TWL LRG LVL3 (GOWN DISPOSABLE) ×6
GRASPER SUT TROCAR 14GX15 (MISCELLANEOUS) ×2 IMPLANT
KIT BASIN OR (CUSTOM PROCEDURE TRAY) ×2 IMPLANT
KIT TURNOVER KIT B (KITS) ×2 IMPLANT
NS IRRIG 1000ML POUR BTL (IV SOLUTION) ×2 IMPLANT
PAD ARMBOARD 7.5X6 YLW CONV (MISCELLANEOUS) ×4 IMPLANT
POUCH RETRIEVAL ECOSAC 10 (ENDOMECHANICALS) ×1 IMPLANT
POUCH RETRIEVAL ECOSAC 10MM (ENDOMECHANICALS) ×2
RELOAD 45 VASCULAR/THIN (ENDOMECHANICALS) IMPLANT
RELOAD STAPLE 45 2.5 WHT GRN (ENDOMECHANICALS) IMPLANT
RELOAD STAPLE 45 3.5 BLU ETS (ENDOMECHANICALS) IMPLANT
RELOAD STAPLE TA45 3.5 REG BLU (ENDOMECHANICALS) IMPLANT
SCISSORS LAP 5X35 DISP (ENDOMECHANICALS) IMPLANT
SET IRRIG TUBING LAPAROSCOPIC (IRRIGATION / IRRIGATOR) ×2 IMPLANT
SET TUBE SMOKE EVAC HIGH FLOW (TUBING) ×2 IMPLANT
SHEARS HARMONIC ACE PLUS 36CM (ENDOMECHANICALS) ×2 IMPLANT
SLEEVE ENDOPATH XCEL 5M (ENDOMECHANICALS) ×2 IMPLANT
SPECIMEN JAR SMALL (MISCELLANEOUS) ×2 IMPLANT
STRIP CLOSURE SKIN 1/2X4 (GAUZE/BANDAGES/DRESSINGS) ×2 IMPLANT
SUT MNCRL AB 4-0 PS2 18 (SUTURE) ×2 IMPLANT
SUT VICRYL 0 UR6 27IN ABS (SUTURE) ×2 IMPLANT
TOWEL GREEN STERILE (TOWEL DISPOSABLE) ×2 IMPLANT
TOWEL GREEN STERILE FF (TOWEL DISPOSABLE) ×2 IMPLANT
TRAY FOLEY MTR SLVR 16FR STAT (SET/KITS/TRAYS/PACK) IMPLANT
TRAY LAPAROSCOPIC MC (CUSTOM PROCEDURE TRAY) ×2 IMPLANT
TROCAR XCEL BLUNT TIP 100MML (ENDOMECHANICALS) ×2 IMPLANT
TROCAR XCEL NON-BLD 5MMX100MML (ENDOMECHANICALS) ×2 IMPLANT
WATER STERILE IRR 1000ML POUR (IV SOLUTION) ×2 IMPLANT

## 2022-01-17 NOTE — Interval H&P Note (Signed)
History and Physical Interval Note:  01/17/2022 11:32 AM  Stacey Blackwell  has presented today for surgery, with the diagnosis of appendicitis.  The various methods of treatment have been discussed with the patient and family. After consideration of risks, benefits and other options for treatment, the patient has consented to  Procedure(s): APPENDECTOMY LAPAROSCOPIC WITH TAP BLOCKS (N/A) as a surgical intervention.  The patient's history has been reviewed, patient examined, no change in status, stable for surgery.  I have reviewed the patient's chart and labs.  Questions were answered to the patient's satisfaction.     [name]

## 2022-01-17 NOTE — ED Notes (Signed)
Received verbal report from Grace T RN at this time 

## 2022-01-17 NOTE — Consult Note (Signed)
Neurology Consultation Reason for Consult: Right-sided numbness Referring Physician: Curatalo  CC: Right-sided numbness  History is obtained from: Patient  HPI: Stacey Blackwell is a 46 y.o. female with a history of migraines, including migraines with visual aura who presents with abdominal pain.  In the setting, she also developed some right-sided numbness that started periorally and then spread down to involve her arm.  There was tingling associated with it.  She has not had paresthesias associated with migraines before.  She did have a headache as well as photophobia with this episode.  The episode started last night, and then progressed throughout the day, but resolved completely following a migraine cocktail.   ROS: A 14 point ROS was performed and is negative except as noted in the HPI.  Past Medical History:  Diagnosis Date   Gallstones      Family History  Problem Relation Age of Onset   Healthy Mother    Colon cancer Neg Hx    Esophageal cancer Neg Hx    Rectal cancer Neg Hx    Stomach cancer Neg Hx      Social History:  reports that she has never smoked. She has never used smokeless tobacco. She reports current alcohol use. She reports that she does not use drugs.   Exam: Current vital signs: BP (!) 101/55    Pulse 64    Temp 98.1 F (36.7 C) (Oral)    Resp 15    Ht 5\' 2"  (1.575 m)    Wt 88 kg    LMP 03/04/2017    SpO2 100%    BMI 35.48 kg/m  Vital signs in last 24 hours: Temp:  [98 F (36.7 C)-98.1 F (36.7 C)] 98.1 F (36.7 C) (01/24 1952) Pulse Rate:  [60-73] 64 (01/24 2330) Resp:  [12-16] 15 (01/24 2330) BP: (101-118)/(55-76) 101/55 (01/24 2330) SpO2:  [98 %-100 %] 100 % (01/24 2330) Weight:  [88 kg] 88 kg (01/24 1630)   Physical Exam  Constitutional: Appears well-developed and well-nourished.  Psych: Affect appropriate to situation Eyes: No scleral injection HENT: No OP obstruction MSK: no joint deformities.  Cardiovascular: Normal rate  and regular rhythm.  Respiratory: Effort normal, non-labored breathing GI: Soft.  No distension. There is no tenderness.  Skin: WDI  Neuro: Mental Status: Patient is awake, alert, oriented to person, place, month, year, and situation. Patient is able to give a clear and coherent history. No signs of aphasia or neglect Cranial Nerves: II: Visual Fields are full. Pupils are equal, round, and reactive to light.   III,IV, VI: EOMI without ptosis or diploplia.  V: Facial sensation is symmetric to temperature VII: Facial movement is symmetric.  VIII: hearing is intact to voice X: Uvula elevates symmetrically XI: Shoulder shrug is symmetric. XII: tongue is midline without atrophy or fasciculations.  Motor: Tone is normal. Bulk is normal. 5/5 strength was present in all four extremities.  Sensory: Sensation is symmetric to light touch and temperature in the arms and legs. Deep Tendon Reflexes: 2+ and symmetric in the biceps and patellae.  Plantars: Toes are downgoing bilaterally.  Cerebellar: FNF and HKS are intact bilaterally    I have reviewed labs in epic and the results pertinent to this consultation are: BMP-unremarkable other than mild hypocalcemia  I have reviewed the images obtained: MRI brain-normal  Impression: 46 year old female with a history of migraine with aura who presents with right-sided paresthesia in the setting of migraine which resolved with migraine cocktail.  This constellation is  very much consistent with complicated migraine, and I do not think any further evaluation is needed at this time.  No contraindications to proceeding with surgery.  Recommendations: 1) neurology will be available as needed   Roland Rack, MD [facility] [phone]  If 7pm- 7am, please page neurology on call as listed in [place].

## 2022-01-17 NOTE — Anesthesia Postprocedure Evaluation (Signed)
Anesthesia Post Note  Patient: Stacey Blackwell  Procedure(s) Performed: APPENDECTOMY LAPAROSCOPIC WITH TAP BLOCKS     Patient location during evaluation: PACU Anesthesia Type: General Level of consciousness: awake and alert, oriented and patient cooperative Pain management: pain level controlled Vital Signs Assessment: post-procedure vital signs reviewed and stable Respiratory status: spontaneous breathing, nonlabored ventilation and respiratory function stable Cardiovascular status: blood pressure returned to baseline and stable Postop Assessment: no apparent nausea or vomiting Anesthetic complications: no   No notable events documented.  Last Vitals:  Vitals:   01/17/22 1325 01/17/22 1335  BP: 111/64 108/62  Pulse: 77 73  Resp: 14 16  Temp:  36.5 C  SpO2: 96% 99%    Last Pain:  Vitals:   01/17/22 1325  TempSrc:   PainSc: Asleep                 Lannie Fields

## 2022-01-17 NOTE — Discharge Instructions (Signed)
CIRUGIA LAPAROSCOPICA: INSTRUCCIONES DE POST OPERATORIO.  Revise siempre los documentos que le entreguen en el lugar donde se ha hecho la cirugia.  SI USTED NECESITA DOCUMENTOS DE INCAPACIDAD (DISABLE) O DE PERMISO FAMILAR (FAMILY LEAVE) NECESITA TRAERLOS A LA OFICINA PARA QUE SEAN PROCESADOS. NO  SE LOS DE A SU DOCTOR. A su alta del hospital se le dara una receta para controlar el dolor. Tomela como ha sido recetada, si la necesita. Si no la necesita puede tomar, Acetaminofen (Tylenol) o Ibuprofen (Advil) para aliviar dolor moderado. Continue tomando el resto de sus medicinas. Si necesita rellenar la receta, llame a la farmacia. ellos contactan a nuestra oficina pidiendo autorizacion. Este tipo de receta no pueden ser rellenadas despues de las  5pm o durante los fines de semana. Con relacion a la dieta: debe ser ligera los primeros dias despues que llege a la casa. Ejemplo: sopas y galleticas. Tome bastante liquido esos dias. La mayoria de los pacientes padecen de inflamacion y cambio de coloracion de la piel alrededor de las incisiones. esto toma dias en resolver.  pnerse una bolsa de hielo en el area affectada ayuda..  Es comun tambien tener un poco de estrenimiento si esta tomado medicinas para el dolor. incremente la cantidad de liquidos a tomar y puede tomar (Colace) esto previene el problema. Si ya tiene estrenimiento, es decir no ha defecado en 48 horas, puede tomar un laxativo (Milk of Magnesia or Miralax) uselo como el paquete le explica.  A menos que se le diga algo diferente. Remueva el bendaje a las 24-48 horas despues dela cirugia. y puede banarse en la ducha sin ningun problema. usted puede tener steri-strips (pequenas curitas transparentes en la piel puesta encima de la incision)  Estas banditas strips should be left on the skin for 7-10 days.   Si su cirujano puso pegamento encima de la incision usted puede banarse bajo la ducha en 24 horas. Este pegamento empezara a caerse en las  proximas 2-3 semanas. Si le pusieron suturas o presillas (grapos) estos seran quitados en su proxima cita en la oficina. . ACTIVIDADES:  Puede hacer actividad ligera.  Como caminar , subir escaleras y poco a poco irlas incrementando tanto como las tolere. Puede tener relaciones sexuales cuando sea comfortable. No carge objetos pesados o haga esfuerzos que no sean aprovados por su doctor. Puede manejar en cuanto no esta tomando medicamentos fuertes (narcoticos) para el dolor, pueda abrochar confortablemente el cinturon de seguridad, y pueda maniobrar y usar los pedales de su vehiculo con seguridad. PUEDE REGRESAR A TRABAJAR  Debe ver a su doctor para una cita de seguimiento en 2-3 semanas despues de la cirugia.  OTRAS ISNSTRUCCIONES:___________________________________________________________________________________ CUANDO LLAMAR A SU MEDICO: FIEBRE mayor de  101.0 No produccion de orina. Sangramiento continue de la herida Incremento de dolor, enrojecimientio o drenaje de la herida (incision) Incremento de dolor abdominal.  The clinic staff is available to answer your questions during regular business hours.  Please don't hesitate to call and ask to speak to one of the nurses for clinical concerns.  If you have a medical emergency, go to the nearest emergency room or call 911.  A surgeon from [facility] is always on call at the hospital. [address], [address], [place], [place]  [address] ? [address] [address], [place], [place]   [address] [phone] ? [phone] ? FAX [phone] Web site: [email or web address]  

## 2022-01-17 NOTE — Transfer of Care (Signed)
Immediate Anesthesia Transfer of Care Note  Patient: Stacey Blackwell  Procedure(s) Performed: APPENDECTOMY LAPAROSCOPIC WITH TAP BLOCKS  Patient Location: PACU  Anesthesia Type:General  Level of Consciousness: awake, alert  and oriented  Airway & Oxygen Therapy: Patient Spontanous Breathing and Patient connected to face mask oxygen  Post-op Assessment: Report given to RN and Post -op Vital signs reviewed and stable  Post vital signs: Reviewed and stable  Last Vitals:  Vitals Value Taken Time  BP 97/58 01/17/22 1256  Temp 36.5 C 01/17/22 1255  Pulse 83 01/17/22 1310  Resp 14 01/17/22 1310  SpO2 97 % 01/17/22 1310  Vitals shown include unvalidated device data.  Last Pain:  Vitals:   01/17/22 1255  TempSrc:   PainSc: Asleep         Complications: No notable events documented.

## 2022-01-17 NOTE — Anesthesia Preprocedure Evaluation (Signed)
Anesthesia Evaluation  Patient identified by MRN, date of birth, ID band Patient awake    Reviewed: Allergy & Precautions, NPO status , Patient's Chart, lab work & pertinent test results  Airway Mallampati: III  TM Distance: >3 FB Neck ROM: Full    Dental no notable dental hx. (+) Dental Advisory Given, Teeth Intact   Pulmonary  Snores at night, no witnessed apneas, never had sleep study    Pulmonary exam normal breath sounds clear to auscultation       Cardiovascular negative cardio ROS Normal cardiovascular exam Rhythm:Regular Rate:Normal     Neuro/Psych  Headaches, Presented to ED w/ migraine with aura negative psych ROS   GI/Hepatic Neg liver ROS, Acute appendicitis    Endo/Other  Obesity BMI 36  Renal/GU negative Renal ROS  negative genitourinary   Musculoskeletal negative musculoskeletal ROS (+)   Abdominal (+) + obese,   Peds negative pediatric ROS (+)  Hematology negative hematology ROS (+) hct 37.2    Anesthesia Other Findings   Reproductive/Obstetrics negative OB ROS                             Anesthesia Physical Anesthesia Plan  ASA: 3  Anesthesia Plan: General   Post-op Pain Management: Minimal or no pain anticipated   Induction: Intravenous  PONV Risk Score and Plan: 4 or greater and Ondansetron, Dexamethasone, Midazolam and Treatment may vary due to age or medical condition  Airway Management Planned: Oral ETT  Additional Equipment: None  Intra-op Plan:   Post-operative Plan: Extubation in OR  Informed Consent: I have reviewed the patients History and Physical, chart, labs and discussed the procedure including the risks, benefits and alternatives for the proposed anesthesia with the patient or authorized representative who has indicated his/her understanding and acceptance.     Dental advisory given  Plan Discussed with: CRNA  Anesthesia Plan Comments:          Anesthesia Quick Evaluation

## 2022-01-17 NOTE — Anesthesia Procedure Notes (Signed)
Anesthesia Regional Block: TAP block   Pre-Anesthetic Checklist: , timeout performed,  Correct Patient, Correct Site, Correct Laterality,  Correct Procedure, Correct Position, site marked,  Risks and benefits discussed,  Surgical consent,  Pre-op evaluation,  At surgeon's request and post-op pain management  Laterality: Right and Left  Prep: Maximum Sterile Barrier Precautions used, chloraprep       Needles:  Injection technique: Single-shot  Needle Type: Echogenic Stimulator Needle     Needle Length: 9cm  Needle Gauge: 22     Additional Needles:   Procedures:,,,, ultrasound used (permanent image in chart),,    Narrative:  Start time: 01/17/2022 11:52 AM End time: 01/17/2022 11:57 AM Injection made incrementally with aspirations every 5 mL.  Performed by: Personally  Anesthesiologist: Lannie Fields, DO  Additional Notes: Monitors applied. No increased pain on injection. No increased resistance to injection. Injection made in 5cc increments. Good needle visualization. Patient tolerated procedure well.

## 2022-01-17 NOTE — Op Note (Signed)
Preoperative diagnosis acute appendicitis Postoperative diagnosis: Acute appendicitis Procedure: Laparoscopic appendectomy Surgeon: Dr. Harden Mo Anesthesia: General Estimated blood loss: Minimal Complications: None Drains: Specimens: Appendix to pathology Special count was correct completion Decision to recovery stable condition  Indications: 45 yof with rlq pain and tenderness, ct with appendicits. We discussed going to OR for appendectomy.   Procedure: After informed consent was obtained the patient was taken the operating room.  She was already given antibiotics.  SCDs were in place.  She was placed under general anesthesia without complication.  She was prepped and draped in a standard sterile surgical fashion.  A Foley catheter was placed.  Surgical timeout was then performed.  I infiltrated Marcaine below the umbilicus.  I made a vertical incision.  I grasped the fascia and incised this sharply.  I entered the peritoneum bluntly without injury.  I placed a 0 Vicryl pursestring suture through the fascia.  I inserted a Hassan trocar and insufflated the abdomen to 15 mmHg pressure.  I then inserted 2 additional 5 mm trocars in the suprapubic region and left lower quadrant under direct vision.  She was noted to have acute appendicitis.  I identified his terminal ileum and the cecum.  I then was able to rotate the appendix.  I took down the appendiceal mesentery with the harmonic scalpel.  I then divided the base with a GIA stapler.  This was placed in a retrieval bag and removed from the abdomen.  The staple line was hemostatic.  There is no evidence of any injury.  I then remove the [facility] trocar and tied the pursestring down.  I placed 1 additional 0 Vicryl suture through the umbilicus to completely obliterate that defect.  I then desufflated the abdomen to remove the remaining trocars.  These were closed with 4-0 Monocryl and glue.  She tolerated his well was extubated transferred to  recovery stable

## 2022-01-17 NOTE — Discharge Summary (Signed)
Patient ID: Stacey Blackwell [id number] 03/05/1976 46 y.o.  Admit date: 01/16/2022 Discharge date: 01/17/2022  Admitting Diagnosis: Acute Appendicitis HA, R sided numbness  Discharge Diagnosis Acute Appendicitis Complex Migraine per Neurology   Consultants Neurology   Reason for Admission: 46 year old woman who [facility] this afternoon with complaints of headache, fatigue, and numbness of the right lower lip, which radiated up the right side of the face and right shoulder with tingling.  This occurred about a month ago as well and resolved spontaneously after a few hours.  Has also been experiencing intermittent frontal headaches for the last 2 months as well as intermittent dizziness, her eyes feeling heavier than normal, and also, right lower quadrant abdominal pain which began early this morning.  She was found on initial evaluation to have altered sensation to the right side of her face, right upper extremity, right lower extremity and right upper back.  Was transferred to [facility] for further work-up.  Had a CT scan of her head as well as an MRI of her brain both of which were negative for acute abnormality.  Ultimately was noted to have some right lower quadrant abdominal pain and tenderness, and underwent a CT scan of the pelvis demonstrating uncomplicated appendicitis.  Currently reports that the numbness and tingling has resolved  Procedures Dr. Dwain SarnaWakefield - Laparoscopic Appendectomy - 01/17/2022  Hospital Course:  Patient underwent workup and was found to have Acute Appendicitis. She also complained of HA and R sided numbness on presentation. CTH and MRI brain were negative. Neurology consulted and felt symptoms correlated w/ a complex migraine. Neurology cleared for OR. The patient was admitted and underwent a laparoscopic appendectomy.  The patient tolerated the procedure well.  She was discharged from the PACU. Follow up as  noted below.   I did not see the patient during their hospital stay, therefore the information in this discharge summary was taken entirely from the chart.  Allergies as of 01/17/2022   No Known Allergies      Medication List     STOP taking these medications    naproxen 500 MG tablet Commonly known as: NAPROSYN   predniSONE 20 MG tablet Commonly known as: DELTASONE       TAKE these medications    acetaminophen 500 MG tablet Commonly known as: TYLENOL Take 2 tablets (1,000 mg total) by mouth every 8 (eight) hours as needed.   ibuprofen 200 MG tablet Commonly known as: ADVIL Take 4 tablets (800 mg total) by mouth every 8 (eight) hours as needed. What changed:  when to take this reasons to take this   oxyCODONE 5 MG immediate release tablet Commonly known as: Roxicodone Take 1 tablet (5 mg total) by mouth every 6 (six) hours as needed.          Follow-up Information     [facility].   Specialty: General Surgery Why: Daiva EvesEstamos trabajando para hacer su cita de seguimiento para usted. Por favor llame para confirmar la fecha y hora de la cita. Llegue 30 minutos antes de la cita para completar el proceso de registro y [place]iga una identificacin con foto y tarjeta de seguro si los tiene Contact information: [address] [place] [id number] [phone]         [facility] Follow up.   Why: As needed Contact information: [address], [address] [place] [id number] [phone]  Signed: Alferd Apa, [facility] 01/17/2022, 1:07 PM Please see Amion for pager number during day hours 7:00am-4:30pm

## 2022-01-17 NOTE — Anesthesia Procedure Notes (Signed)
Procedure Name: Intubation Date/Time: 01/17/2022 11:51 AM Performed by: Alain Marion, CRNA Pre-anesthesia Checklist: Patient identified, Emergency Drugs available, Suction available and Patient being monitored Patient Re-evaluated:Patient Re-evaluated prior to induction Oxygen Delivery Method: Circle system utilized Preoxygenation: Pre-oxygenation with 100% oxygen Induction Type: IV induction and Rapid sequence Ventilation: Mask ventilation without difficulty Laryngoscope Size: Mac and 3 Grade View: Grade I Tube type: Oral Tube size: 7.0 mm Number of attempts: 1 Airway Equipment and Method: Stylet Placement Confirmation: ETT inserted through vocal cords under direct vision, positive ETCO2 and breath sounds checked- equal and bilateral Secured at: 21 cm Tube secured with: Tape Dental Injury: Teeth and Oropharynx as per pre-operative assessment

## 2022-01-18 ENCOUNTER — Encounter (HOSPITAL_COMMUNITY): Payer: Self-pay | Admitting: General Surgery

## 2022-01-18 LAB — SURGICAL PATHOLOGY

## 2022-08-09 ENCOUNTER — Encounter: Payer: Self-pay | Admitting: Gastroenterology

## 2023-07-31 IMAGING — MR MR HEAD W/O CM
7 of 12 series · 26 of 48 positions shown · non-contrast
Comparison: No prior MRI, correlation is made with CT head
01/16/2022

CLINICAL DATA: Right lower lip numbness, headache, fatigue, stroke
suspected

EXAM:
MRI HEAD WITHOUT CONTRAST
TECHNIQUE: Multiplanar, multiecho pulse sequences of the brain and surrounding
structures were obtained without intravenous contrast.

[Series 3: DWI · axial · 3.0mm · 0.94mm/px · z∈[-65,+88]mm · 8 of 104 slices shown (1 of 2)]
[im 1/104]
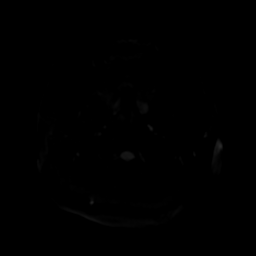
[im 15/104]
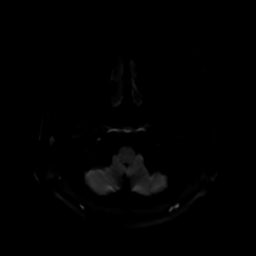
[im 30/104]
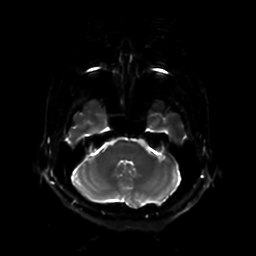
[im 45/104]
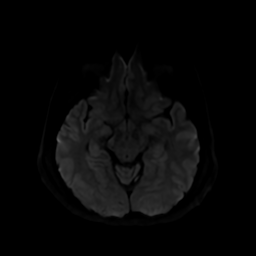
[im 59/104]
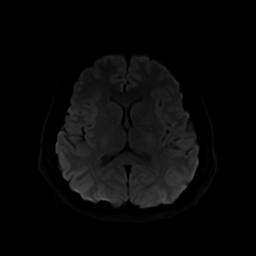
[im 74/104]
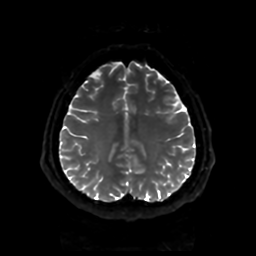
[im 89/104]
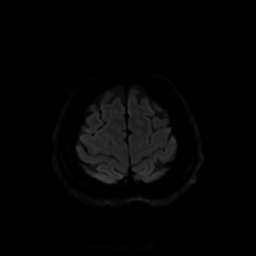
[im 104/104]
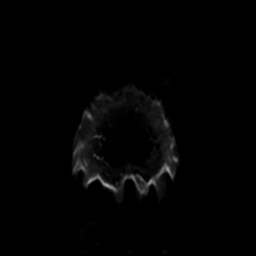

[Series 4: DWI · coronal · 4.0mm · 0.94mm/px · 5 of 71 slices shown (2 of 2)]
[im 1/71]
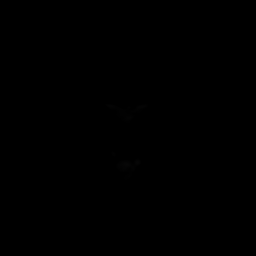
[im 18/71]
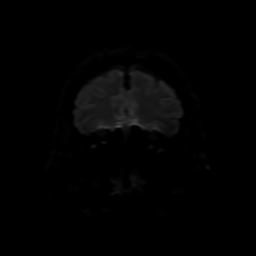
[im 36/71]
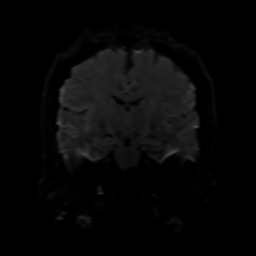
[im 53/71]
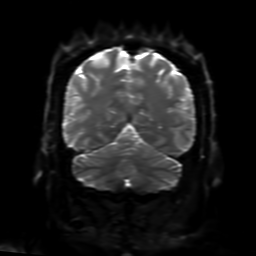
[im 71/71]
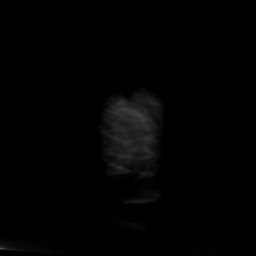

[Series 5: FLAIR · sagittal · 5.0mm · 0.23mm/px · 2 of 27 slices shown (1 of 3)]
[im 1/27]
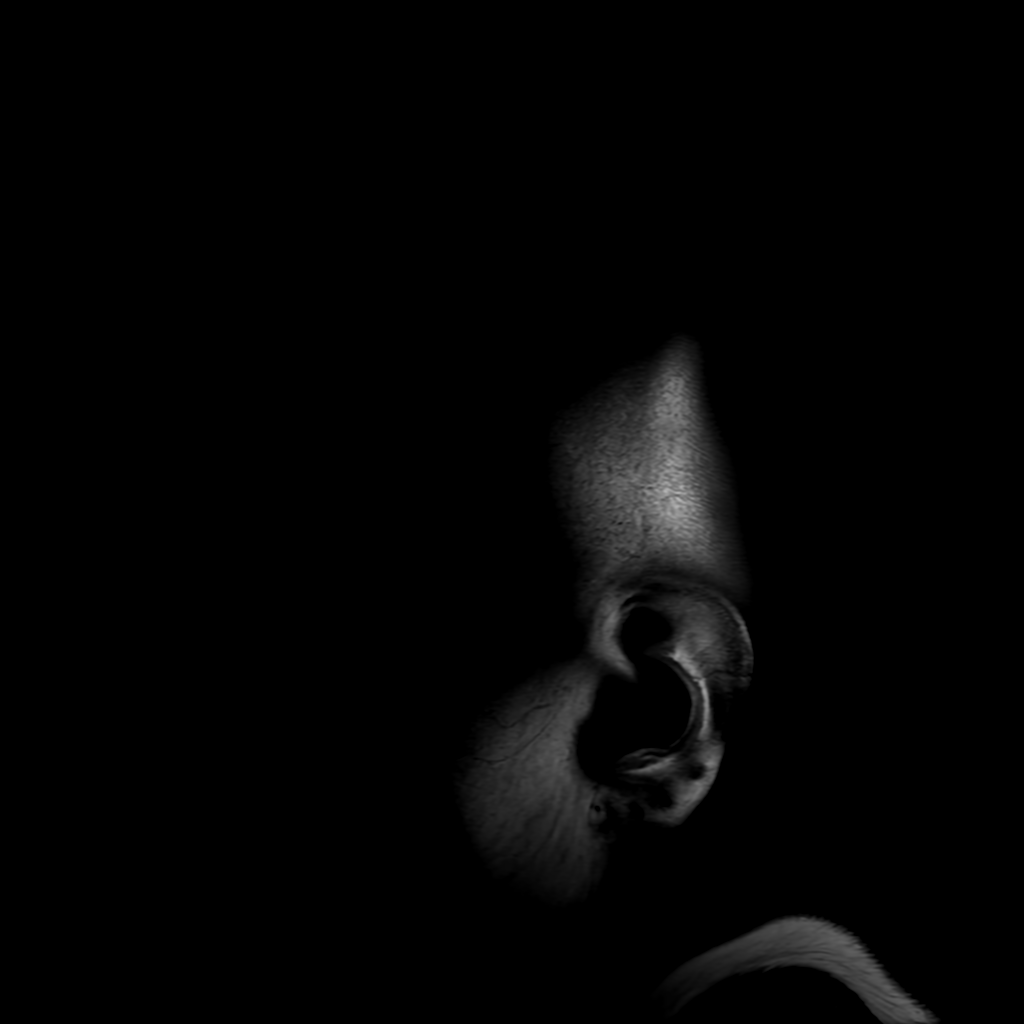
[im 27/27]
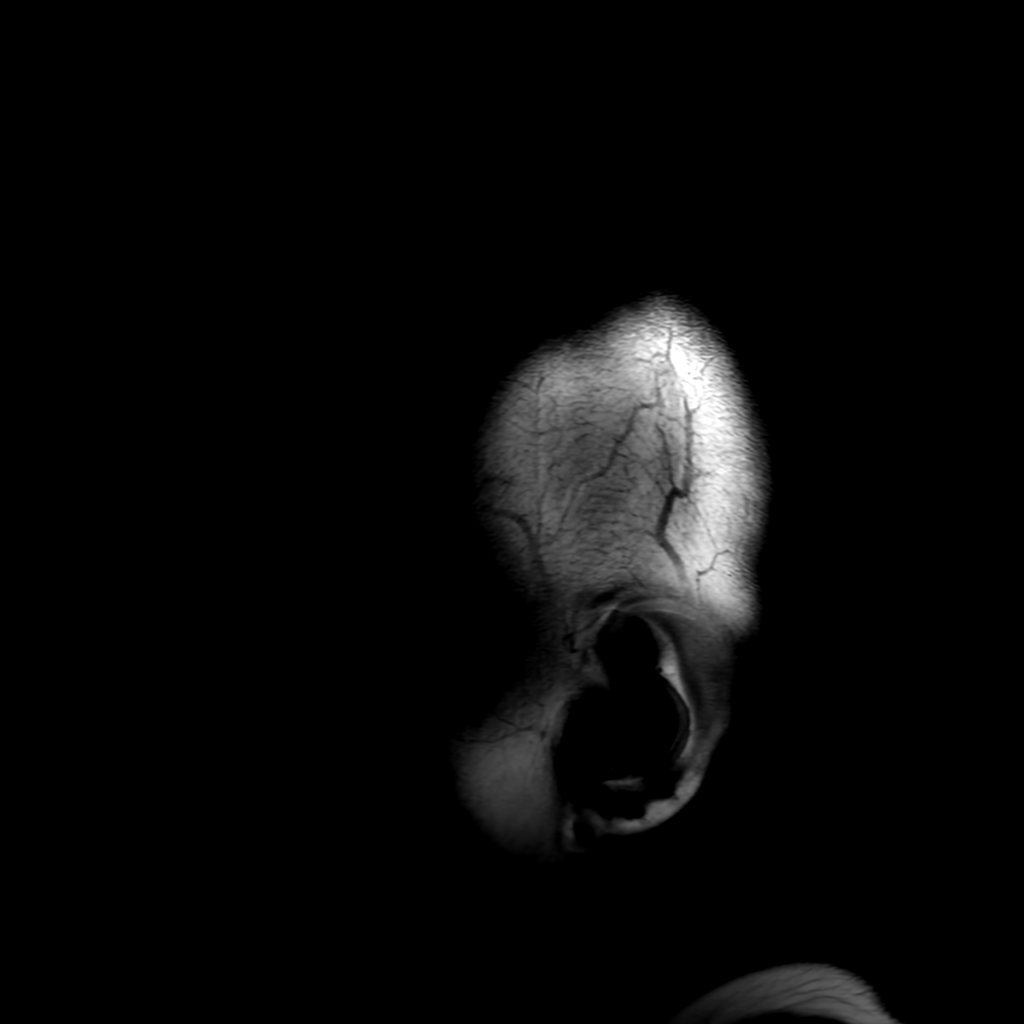

[Series 7: FLAIR · axial · 4.0mm · 0.45mm/px · z∈[-65,+84]mm · 2 of 35 slices shown (2 of 3)]
[im 1/35]
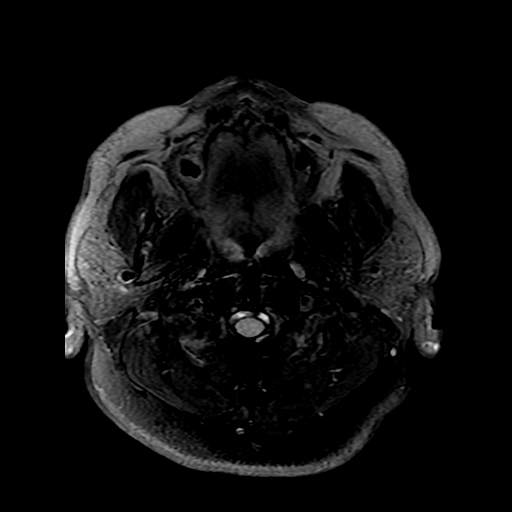
[im 35/35]
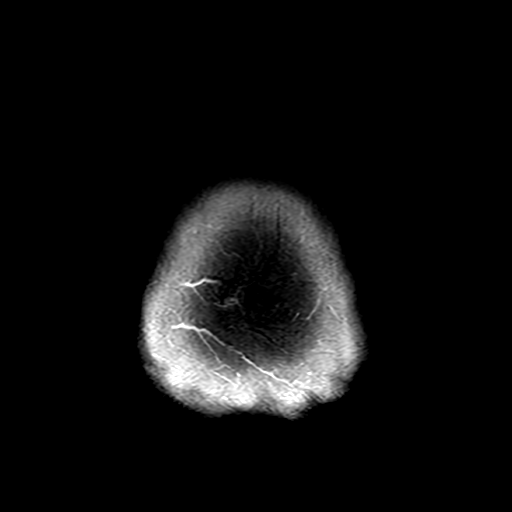

[Series 12: FLAIR · axial · 4.0mm · 0.45mm/px · z∈[-65,+84]mm · 2 of 35 slices shown (3 of 3)]
[im 1/35]
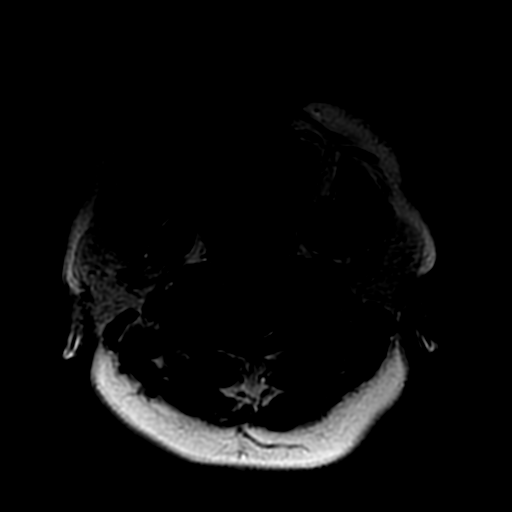
[im 35/35]
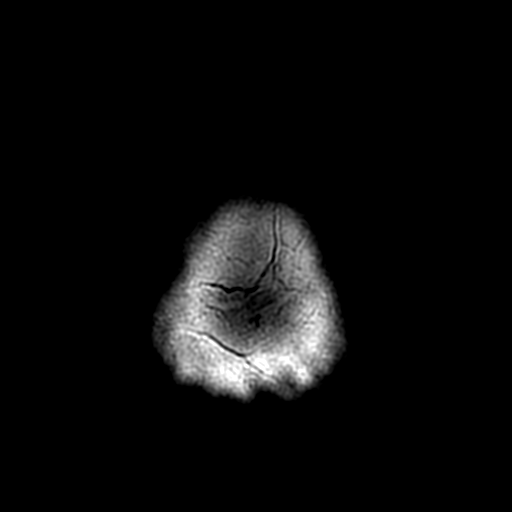

[Series 350: ADC · axial · 3.0mm · 0.94mm/px · z∈[-65,+88]mm · 4 of 52 slices shown (1 of 2)]
[im 1/52]
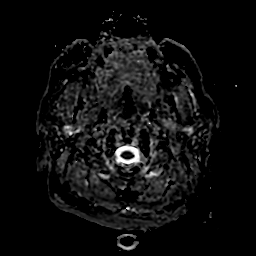
[im 18/52]
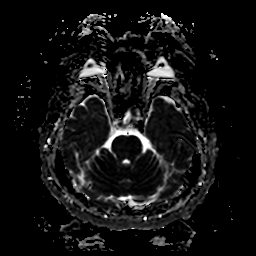
[im 35/52]
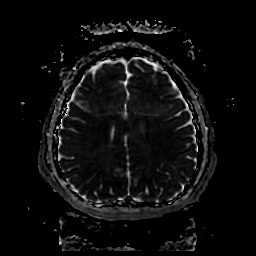
[im 52/52]
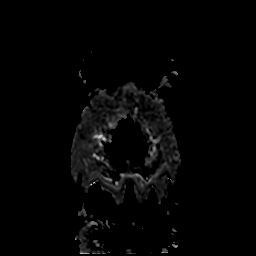

[Series 450: ADC · coronal · 4.0mm · 0.94mm/px · 3 of 36 slices shown (2 of 2)]
[im 1/36]
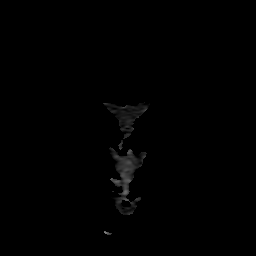
[im 18/36]
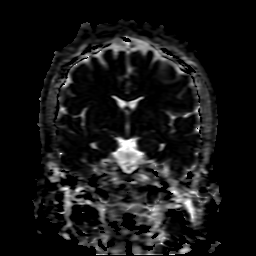
[im 36/36]
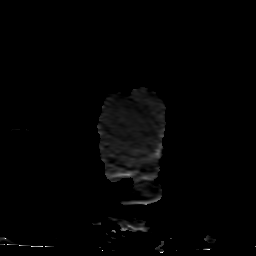

[26 of 48 positions shown; findings below may reference images not displayed]

FINDINGS: Brain: No restricted diffusion to suggest acute or subacute infarct.
No acute hemorrhage, mass, mass effect, or midline shift. No foci of
hemosiderin deposition to suggest remote hemorrhage. No
hydrocephalus or extra-axial collection. No abnormal T2 hyperintense
signal in the periventricular white matter.

Vascular: Normal flow voids.

Skull and upper cervical spine: Normal marrow signal.

Sinuses/Orbits: Negative.

Other: The mastoids are well aerated.
IMPRESSION: Normal MRI of the brain.
